# Patient Record
Sex: Female | Born: 2000 | Hispanic: Yes | Marital: Single | Smoking: Never smoker
Health system: Southern US, Community
[De-identification: ages and names within clinical notes are randomized; demographics above are authoritative.]

## PROBLEM LIST (undated history)

## (undated) DIAGNOSIS — F99 Mental disorder, not otherwise specified: Secondary | ICD-10-CM

## (undated) DIAGNOSIS — D649 Anemia, unspecified: Secondary | ICD-10-CM

## (undated) DIAGNOSIS — J45909 Unspecified asthma, uncomplicated: Secondary | ICD-10-CM

## (undated) HISTORY — PX: NO PAST SURGERIES: SHX2092

## (undated) HISTORY — DX: Anemia, unspecified: D64.9

## (undated) HISTORY — DX: Mental disorder, not otherwise specified: F99

---
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
## 2021-01-02 DIAGNOSIS — F919 Conduct disorder, unspecified: Secondary | ICD-10-CM | POA: Diagnosis not present

## 2021-01-09 DIAGNOSIS — F919 Conduct disorder, unspecified: Secondary | ICD-10-CM | POA: Diagnosis not present

## 2021-02-12 ENCOUNTER — Encounter: Payer: Self-pay | Admitting: Emergency Medicine

## 2021-02-12 ENCOUNTER — Emergency Department
Admission: EM | Admit: 2021-02-12 | Discharge: 2021-02-12 | Disposition: A | Discharge status: Home / Self Care | Payer: Medicaid Other | Attending: Emergency Medicine | Admitting: Emergency Medicine

## 2021-02-12 DIAGNOSIS — J45909 Unspecified asthma, uncomplicated: Secondary | ICD-10-CM | POA: Diagnosis not present

## 2021-02-12 DIAGNOSIS — H6093 Unspecified otitis externa, bilateral: Secondary | ICD-10-CM | POA: Diagnosis not present

## 2021-02-12 DIAGNOSIS — H9202 Otalgia, left ear: Secondary | ICD-10-CM | POA: Diagnosis present

## 2021-02-12 DIAGNOSIS — H6692 Otitis media, unspecified, left ear: Secondary | ICD-10-CM | POA: Diagnosis not present

## 2021-02-12 DIAGNOSIS — H60503 Unspecified acute noninfective otitis externa, bilateral: Secondary | ICD-10-CM

## 2021-02-12 DIAGNOSIS — H60502 Unspecified acute noninfective otitis externa, left ear: Secondary | ICD-10-CM | POA: Diagnosis not present

## 2021-02-12 MED ORDER — AMOXICILLIN-POT CLAVULANATE 875-125 MG PO TABS: 1.0000 | ORAL_TABLET | Freq: Two times a day (BID) | ORAL | 0 refills | Status: AC

## 2021-02-12 MED ORDER — CIPROFLOXACIN-DEXAMETHASONE 0.3-0.1 % OT SUSP: 4.0000 [drp] | Freq: Two times a day (BID) | OTIC | 0 refills | Status: AC

## 2021-02-12 MED ORDER — AMOXICILLIN-POT CLAVULANATE 875-125 MG PO TABS
1.0000 | ORAL_TABLET | Freq: Once | ORAL | Status: AC
  Administered 2021-02-12: 1 via ORAL
  Filled 2021-02-12: qty 1

## 2021-02-12 MED ORDER — CIPROFLOXACIN-DEXAMETHASONE 0.3-0.1 % OT SUSP
4.0000 [drp] | OTIC | Status: AC
  Administered 2021-02-12: 4 [drp] via OTIC
  Filled 2021-02-12: qty 7.5

## 2021-02-12 NOTE — Discharge Instructions (Addendum)
As we discussed, you appear to have an infection in both of your ears but worse on the left.  Please use the prescribed antibiotic pills as well as the drops.  Place 4 drops into both ears twice daily for 1 week.  Complete the full course of antibiotic pills as prescribed.  Call the office of Dr. Andee Poles to schedule a follow-up appointment with him or one of his colleagues in about a week.

## 2021-02-12 NOTE — ED Provider Notes (Signed)
[facility] Emergency Department Provider Note  ____________________________________________   Event Date/Time   First MD Initiated Contact with Patient 02/12/21 [phone]     (approximate)  I have reviewed the triage vital signs and the nursing notes.   HISTORY  Chief Complaint Ear Pain    HPI Betty Cabrera is a 20 y.o. female who presents for evaluation of pain and decreased hearing in her left ear.  It started about 3 days ago.  She has not had any drainage nor fever but now she is starting to have pain on the right side as well.  She thinks she went swimming once or twice but does not swim regularly.  Nothing in particular makes it better or worse.  No swelling [place] Plan which she is aware.  No difficulty swallowing.  However the pain does seem to radiate down the left ear into the side of the neck.     Past Medical History:  Diagnosis Date   Asthma     Patient Active Problem List   Diagnosis Date Noted   Vitamin D deficiency 11/13/2015   Allergic rhinitis due to pollen 11/13/2015   Elevated blood pressure 10/04/2015   Obesity, pediatric, BMI 95th to 98th percentile for age 73/02/2016   Mild intermittent asthma 10/04/2015    History reviewed. No pertinent surgical history.  Prior to Admission medications   Medication Sig Start Date End Date Taking? Authorizing Provider  amoxicillin-clavulanate (AUGMENTIN) 875-125 MG tablet Take 1 tablet by mouth every 12 (twelve) hours for 7 days. 02/12/21 02/19/21 Yes Loleta Rose, MD  ciprofloxacin-dexamethasone (CIPRODEX) OTIC suspension Place 4 drops into both ears 2 (two) times daily for 7 days. 02/12/21 02/19/21 Yes Loleta Rose, MD  albuterol (PROVENTIL HFA;VENTOLIN HFA) 108 (90 Base) MCG/ACT inhaler Inhale 2-4 puffs into the lungs every 4 (four) hours as needed for wheezing (or cough). 11/14/15   Verneda Skill, FNP  cetirizine (ZYRTEC) 1 MG/ML syrup Take 10 mLs (10 mg total) by mouth daily. As needed  for allergy symptoms Patient not taking: Reported on 12/06/2015 11/14/15   Alfonso Ramus T, FNP  permethrin (ELIMITE) 5 % cream Apply overnight. Shower the next day and wash the bed linens in hot water. Repeat entire process in 1 week. 12/06/15   Kalman Jewels, MD    Allergies Patient has no known allergies.  History reviewed. No pertinent family history.  Social History Social History   Tobacco Use   Smoking status: Never   Smokeless tobacco: Never  Vaping Use   Vaping Use: Never used  Substance Use Topics   Alcohol use: Never    Alcohol/week: 0.0 standard drinks   Drug use: Never    Review of Systems Constitutional: No fever/chills Eyes: No visual changes. ENT: Positive for left ear pain decreased hearing.  No sore throat. Respiratory: Denies shortness of breath. Gastrointestinal: No abdominal pain.  No nausea, no vomiting.  No diarrhea.  No constipation. Neurological: Negative for headaches, focal weakness or numbness.   ____________________________________________   PHYSICAL EXAM:  VITAL SIGNS: ED Triage Vitals  Enc Vitals Group     BP 02/12/21 0048 (!) 144/77     Pulse Rate 02/12/21 0048 (!) 102     Resp 02/12/21 0048 20     Temp 02/12/21 0048 98.7 F (37.1 C)     Temp Source 02/12/21 0048 Oral     SpO2 02/12/21 0048 100 %     Weight 02/12/21 0042 113.4 kg (250 lb)  Height 02/12/21 0042 1.676 m (5\' 6" )     Head Circumference --      Peak Flow --      Pain Score --      Pain Loc --      Pain Edu? --      Excl. in GC? --     Constitutional: Alert and oriented.  Eyes: Conjunctivae are normal.  Head: Atraumatic.  No tenderness to palpation of the mastoid process. Ears: Right ear canal has some minimal debris and erythema but a healthy appearing tympanic membrane.  The left ear canal has a small amount of swelling but is very erythematous with extensive debris consistent with otitis media.  Her tympanic membrane also appears erythematous with an  effusion behind it. Nose: No congestion/rhinnorhea. Mouth/Throat: Patient is wearing a mask. Neck: No stridor.  No meningeal signs.  No tenderness to palpation and no lymphadenopathy. Cardiovascular: Normal rate, regular rhythm. Good peripheral circulation. Respiratory: Normal respiratory effort.  No retractions. Neurologic:  Normal speech and language. No gross focal neurologic deficits are appreciated.  Skin:  Skin is warm, dry and intact. Psychiatric: Mood and affect are normal. Speech and behavior are normal.  ____________________________________________    INITIAL IMPRESSION / MDM / ASSESSMENT AND PLAN / ED COURSE  As part of my medical decision making, I reviewed the following data within the electronic MEDICAL RECORD NUMBER Nursing notes reviewed and incorporated and Notes from prior ED visits   Signs and symptoms are consistent with otitis media bilaterally although the right side is mild but the left side is significant although not consistent with malignant externa.  Given the involvement of the tympanic membrane and decreased hearing from the left side, I will treat with Ciprodex and Augmentin.  I gave the patient my usual treatment recommendations and follow-up recommendations from ENT as well as return precautions.  She understands any reasons with plan.  ____________________________________________  FINAL CLINICAL IMPRESSION(S) / ED DIAGNOSES  Final diagnoses:  Acute otitis externa of both ears, unspecified type  Left otitis media, unspecified otitis media type     MEDICATIONS GIVEN DURING THIS VISIT:  Medications  ciprofloxacin-dexamethasone (CIPRODEX) 0.3-0.1 % OTIC (EAR) suspension 4 drop (4 drops Both EARS Given 02/12/21 0441)  amoxicillin-clavulanate (AUGMENTIN) 875-125 MG per tablet 1 tablet (1 tablet Oral Given 02/12/21 0456)     ED Discharge Orders          Ordered    amoxicillin-clavulanate (AUGMENTIN) 875-125 MG tablet  Every 12 hours        02/12/21 0450     ciprofloxacin-dexamethasone (CIPRODEX) OTIC suspension  2 times daily        02/12/21 0450             Note:  This document was prepared using Dragon voice recognition software and may include unintentional dictation errors.   02/14/21, MD 02/12/21 [phone]

## 2021-02-12 NOTE — ED Triage Notes (Signed)
Pt c/o left ear pain with decreased ability to hear x3 days. Pt denies drainage as well as fever. Pt sts pain radiates down left side of neck.

## 2021-03-29 ENCOUNTER — Other Ambulatory Visit: Payer: Self-pay

## 2021-03-29 DIAGNOSIS — R21 Rash and other nonspecific skin eruption: Secondary | ICD-10-CM | POA: Diagnosis present

## 2021-03-29 DIAGNOSIS — J45909 Unspecified asthma, uncomplicated: Secondary | ICD-10-CM | POA: Diagnosis not present

## 2021-03-29 DIAGNOSIS — T7840XA Allergy, unspecified, initial encounter: Secondary | ICD-10-CM | POA: Diagnosis not present

## 2021-03-29 DIAGNOSIS — L509 Urticaria, unspecified: Secondary | ICD-10-CM | POA: Diagnosis not present

## 2021-03-29 MED ORDER — DIPHENHYDRAMINE HCL 25 MG PO CAPS: 50.0000 mg | ORAL_CAPSULE | Freq: Once | ORAL | Status: AC

## 2021-03-29 MED ORDER — PREDNISONE 20 MG PO TABS
40.0000 mg | ORAL_TABLET | Freq: Once | ORAL | Status: AC
  Administered 2021-03-29: 40 mg via ORAL

## 2021-03-29 MED ORDER — DIPHENHYDRAMINE HCL 25 MG PO CAPS
ORAL_CAPSULE | ORAL | Status: AC
  Administered 2021-03-29: 50 mg via ORAL
  Filled 2021-03-29: qty 2

## 2021-03-29 NOTE — ED Triage Notes (Addendum)
Pt states around 20 min ago started having burning sensation to neck, head and shoulder. Pt also feels like she has a rash to neck and forehead. Pt has not had any new exposures or cause. Pt with hives noted to face and neck.

## 2021-03-30 ENCOUNTER — Emergency Department
Admission: EM | Admit: 2021-03-30 | Discharge: 2021-03-30 | Disposition: A | Discharge status: Home / Self Care | Payer: Medicaid Other | Attending: Emergency Medicine | Admitting: Emergency Medicine

## 2021-03-30 DIAGNOSIS — L509 Urticaria, unspecified: Secondary | ICD-10-CM

## 2021-03-30 DIAGNOSIS — T7840XA Allergy, unspecified, initial encounter: Secondary | ICD-10-CM | POA: Diagnosis not present

## 2021-03-30 MED ORDER — PREDNISONE 20 MG PO TABS: 40.0000 mg | ORAL_TABLET | Freq: Every day | ORAL | 0 refills | Status: AC

## 2021-03-30 NOTE — ED Notes (Signed)
RN in to check on pt at this time. Pt states symptoms have resolved, rash no longer present.

## 2021-03-30 NOTE — Discharge Instructions (Addendum)
You had an allergic reaction to something, possibly the detergent you recently used.  Please try to avoid coming to contact with anything else that might cause recurrence of your symptoms.  You can use over-the-counter Benadryl or Zyrtec (according to label instructions).  Use the prescribed prednisone to try and avoid additional episodes.  Return to the emergency department if you develop new or worsening symptoms that concern you.

## 2021-03-30 NOTE — ED Provider Notes (Signed)
[facility] Emergency Department Provider Note  ____________________________________________   Event Date/Time   First MD Initiated Contact with Patient 03/30/21 [phone]     (approximate)  I have reviewed the triage vital signs and the nursing notes.   HISTORY  Chief Complaint Rash    HPI Betty Cabrera is a 20 y.o. female with no chronic medical issues who presents for evaluation of cute onset itching and burning along her face and neck with a raised red rash.  She recently changed laundry detergents and thinks it might of been because of that.  It was acute in onset at about 9 PM and nothing in particular made it better or worse.  She describes it as severe.  It completely went away after being treated in triage with Benadryl and prednisone.  Patient denies shortness of breath, difficulty breathing, difficulty speaking, difficulty swallowing, nausea, vomiting, abdominal pain, and diarrhea.  She did not feel lightheaded or dizzy or like she was going to pass out.  She has never had symptoms like this before.  No other recent allergen exposures she can think of.  No drug use.     Past Medical History:  Diagnosis Date   Asthma     Patient Active Problem List   Diagnosis Date Noted   Vitamin D deficiency 11/13/2015   Allergic rhinitis due to pollen 11/13/2015   Elevated blood pressure 10/04/2015   Obesity, pediatric, BMI 95th to 98th percentile for age 20/02/2016   Mild intermittent asthma 10/04/2015    No past surgical history on file.  Prior to Admission medications   Medication Sig Start Date End Date Taking? Authorizing Provider  predniSONE (DELTASONE) 20 MG tablet Take 2 tablets (40 mg total) by mouth daily with breakfast for 4 days. 03/30/21 04/03/21 Yes Loleta Rose, MD  albuterol (PROVENTIL HFA;VENTOLIN HFA) 108 (90 Base) MCG/ACT inhaler Inhale 2-4 puffs into the lungs every 4 (four) hours as needed for wheezing (or cough). 11/14/15    Verneda Skill, FNP  cetirizine (ZYRTEC) 1 MG/ML syrup Take 10 mLs (10 mg total) by mouth daily. As needed for allergy symptoms Patient not taking: Reported on 12/06/2015 11/14/15   Alfonso Ramus T, FNP  permethrin (ELIMITE) 5 % cream Apply overnight. Shower the next day and wash the bed linens in hot water. Repeat entire process in 1 week. 12/06/15   Kalman Jewels, MD    Allergies Patient has no known allergies.  No family history on file.  Social History Social History   Tobacco Use   Smoking status: Never   Smokeless tobacco: Never  Vaping Use   Vaping Use: Never used  Substance Use Topics   Alcohol use: Never    Alcohol/week: 0.0 standard drinks   Drug use: Never    Review of Systems Constitutional: No fever/chills Eyes: No visual changes. ENT: No sore throat. Cardiovascular: Denies chest pain. Respiratory: Denies shortness of breath. Gastrointestinal: No abdominal pain.  No nausea, no vomiting.  No diarrhea.  No constipation. Genitourinary: Negative for dysuria. Musculoskeletal: Negative for neck pain.  Negative for back pain. Integumentary: Positive for itching red rash on face and neck. Neurological: Negative for headaches, focal weakness or numbness.   ____________________________________________   PHYSICAL EXAM:  VITAL SIGNS: ED Triage Vitals [03/29/21 2049]  Enc Vitals Group     BP (!) 156/78     Pulse Rate 93     Resp 20     Temp 98.5 F (36.9 C)  Temp Source Oral     SpO2 98 %     Weight 116.1 kg (256 lb)     Height 1.702 m (5\' 7" )     Head Circumference      Peak Flow      Pain Score 0     Pain Loc      Pain Edu?      Excl. in GC?     Constitutional: Alert and oriented.  Eyes: Conjunctivae are normal.  Head: Atraumatic. Nose: No congestion/rhinnorhea. Mouth/Throat: Patient is wearing a mask. Neck: No stridor.  No meningeal signs.   Cardiovascular: Normal rate, regular rhythm. Good peripheral circulation. Respiratory: Normal  respiratory effort.  No retractions. Gastrointestinal: Soft and nontender. No distention.  Musculoskeletal: No lower extremity tenderness nor edema. No gross deformities of extremities. Neurologic:  Normal speech and language. No gross focal neurologic deficits are appreciated.  Skin:  Skin is warm, dry and intact. Psychiatric: Mood and affect are normal. Speech and behavior are normal.  ____________________________________________    INITIAL IMPRESSION / MDM / ASSESSMENT AND PLAN / ED COURSE  As part of my medical decision making, I reviewed the following data within the electronic MEDICAL RECORD NUMBER Nursing notes reviewed and incorporated, Old chart reviewed, and Notes from prior ED visits   Differential diagnosis includes, but is not limited to, allergic reaction, anaphylaxis, infection.  Vital signs stable.  Patient has been waiting in the emergency department for over 8 hours and the symptoms have completely resolved.  No rebound.  Treated with Benadryl and prednisone in the lobby.  Symptoms consistent with relatively mild allergic reaction.  No evidence of persistent urticaria at this time although the nurse that sought initially told me that it looks urticarial.  I had my usual and customary allergic reaction discussion with the patient and provided recommendations as well as a as a prescription as listed below.  She understands and agrees with the plan.           ____________________________________________  FINAL CLINICAL IMPRESSION(S) / ED DIAGNOSES  Final diagnoses:  Urticaria  Allergic reaction, initial encounter     MEDICATIONS GIVEN DURING THIS VISIT:  Medications  diphenhydrAMINE (BENADRYL) capsule 50 mg (50 mg Oral Given 03/29/21 2058)  predniSONE (DELTASONE) tablet 40 mg (40 mg Oral Given 03/29/21 2059)     ED Discharge Orders          Ordered    predniSONE (DELTASONE) 20 MG tablet  Daily with breakfast        03/30/21 0534             Note:  This  document was prepared using Dragon voice recognition software and may include unintentional dictation errors.   05/30/21, MD 03/30/21 [phone]

## 2021-04-28 DIAGNOSIS — F919 Conduct disorder, unspecified: Secondary | ICD-10-CM | POA: Diagnosis not present

## 2021-05-04 DIAGNOSIS — F919 Conduct disorder, unspecified: Secondary | ICD-10-CM | POA: Diagnosis not present

## 2021-05-13 DIAGNOSIS — F919 Conduct disorder, unspecified: Secondary | ICD-10-CM | POA: Diagnosis not present

## 2021-05-19 DIAGNOSIS — F919 Conduct disorder, unspecified: Secondary | ICD-10-CM | POA: Diagnosis not present

## 2021-05-26 DIAGNOSIS — F919 Conduct disorder, unspecified: Secondary | ICD-10-CM | POA: Diagnosis not present

## 2021-06-02 DIAGNOSIS — F919 Conduct disorder, unspecified: Secondary | ICD-10-CM | POA: Diagnosis not present

## 2021-06-29 DIAGNOSIS — F919 Conduct disorder, unspecified: Secondary | ICD-10-CM | POA: Diagnosis not present

## 2021-07-06 DIAGNOSIS — F919 Conduct disorder, unspecified: Secondary | ICD-10-CM | POA: Diagnosis not present

## 2021-07-14 DIAGNOSIS — F919 Conduct disorder, unspecified: Secondary | ICD-10-CM | POA: Diagnosis not present

## 2021-07-20 DIAGNOSIS — F919 Conduct disorder, unspecified: Secondary | ICD-10-CM | POA: Diagnosis not present

## 2021-07-28 DIAGNOSIS — F919 Conduct disorder, unspecified: Secondary | ICD-10-CM | POA: Diagnosis not present

## 2021-08-03 DIAGNOSIS — F919 Conduct disorder, unspecified: Secondary | ICD-10-CM | POA: Diagnosis not present

## 2021-08-07 ENCOUNTER — Ambulatory Visit (LOCAL_COMMUNITY_HEALTH_CENTER): Payer: Medicaid Other | Admitting: Nurse Practitioner

## 2021-08-07 ENCOUNTER — Other Ambulatory Visit: Payer: Self-pay

## 2021-08-07 ENCOUNTER — Encounter: Payer: Self-pay | Admitting: Family Medicine

## 2021-08-07 VITALS — BP 152/88 | Ht 67.0 in | Wt 252.6 lb

## 2021-08-07 DIAGNOSIS — Z0000 Encounter for general adult medical examination without abnormal findings: Secondary | ICD-10-CM | POA: Diagnosis not present

## 2021-08-07 DIAGNOSIS — Z3009 Encounter for other general counseling and advice on contraception: Secondary | ICD-10-CM

## 2021-08-07 DIAGNOSIS — R030 Elevated blood-pressure reading, without diagnosis of hypertension: Secondary | ICD-10-CM

## 2021-08-07 NOTE — Progress Notes (Signed)
[facility] DEPARTMENT [facility] [address] Main Number: [phone]    Family Planning Visit- Initial Visit  Subjective:  Betty Cabrera is a 21 y.o.  [id number]   being seen today for an initial annual visit and to discuss reproductive life planning.  The patient is currently using No Method - No Contraceptive Precautions for pregnancy prevention. Patient reports   does want a pregnancy in the next year.  Patient has the following medical conditions has Elevated blood pressure; Obesity, pediatric, BMI 95th to 98th percentile for age; Mild intermittent asthma; Vitamin D deficiency; and Allergic rhinitis due to pollen on their problem list.  Chief Complaint  Patient presents with   Gynecologic Exam    Patient reports to the clinic today for a physical.    Patient denies any signs and symptoms    Body mass index is 39.56 kg/m. - Patient is eligible for diabetes screening based on BMI and age >69?  not applicable HA1C ordered? not applicable  Patient reports 1  partner/s in last year. Desires STI screening?  No - patient declines  Has patient been screened once for HCV in the past?  No  No results found for: HCVAB  Does the patient have current drug use (including MJ), have a partner with drug use, and/or has been incarcerated since last result? No  If yes-- Screen for HCV through [facility]   Does the patient meet criteria for HBV testing? No  Criteria:  -Household, sexual or needle sharing contact with HBV -History of drug use -HIV positive -Those with known Hep C   Health Maintenance Due  Topic Date Due   COVID-19 Vaccine (1) Never done   Pneumococcal Vaccine 41-50 Years old (1 - PPSV23 if available, else PCV20) 07/10/2004   CHLAMYDIA SCREENING  04/08/2017   HIV Screening  Never done   Hepatitis C Screening  Never done   INFLUENZA VACCINE  02/26/2021    Review of Systems  Constitutional:  Negative for chills, fever,  malaise/fatigue and weight loss.  HENT:  Negative for congestion, hearing loss and sore throat.   Eyes:  Positive for blurred vision. Negative for double vision and photophobia.  Respiratory:  Positive for wheezing. Negative for shortness of breath.   Cardiovascular:  Negative for chest pain.  Gastrointestinal:  Positive for nausea. Negative for abdominal pain, blood in stool, constipation, diarrhea, heartburn and vomiting.  Genitourinary:  Positive for frequency. Negative for dysuria.  Musculoskeletal:  Negative for back pain, joint pain and neck pain.  Skin:  Negative for itching and rash.  Neurological:  Positive for headaches. Negative for dizziness and weakness.  Endo/Heme/Allergies:  Does not bruise/bleed easily.  Psychiatric/Behavioral:  Negative for depression, substance abuse and suicidal ideas.    The following portions of the patient's history were reviewed and updated as appropriate: allergies, current medications, past family history, past medical history, past social history, past surgical history and problem list. Problem list updated.   See flowsheet for other program required questions.  Objective:   Vitals:   08/07/21 1437  BP: (!) 152/88  Weight: 252 lb 9.6 oz (114.6 kg)  Height: 5\' 7"  (1.702 m)    Physical Exam Constitutional:      Appearance: She is obese.  HENT:     Head: Normocephalic.     Right Ear: Tympanic membrane normal.     Left Ear: Tympanic membrane normal.     Mouth/Throat:     Mouth: Mucous membranes  are moist.     Comments: No visible signs of dental caries. Dr. Metta Clines,  last visit within a year.  Eyes:     Pupils: Pupils are equal, round, and reactive to light.  Cardiovascular:     Rate and Rhythm: Normal rate.  Pulmonary:     Effort: Pulmonary effort is normal.     Breath sounds: Examination of the right-upper field reveals wheezing. Examination of the left-upper field reveals wheezing. Examination of the right-middle field reveals  wheezing. Examination of the left-middle field reveals wheezing. Examination of the right-lower field reveals wheezing. Examination of the left-lower field reveals wheezing. Wheezing present.  Abdominal:     General: Abdomen is flat.     Palpations: Abdomen is soft.  Genitourinary:    Comments: No vaginal exam performed  Musculoskeletal:     Cervical back: Full passive range of motion without pain, normal range of motion and neck supple.  Skin:    General: Skin is warm and dry.     Comments: Dark discoloration to neck.   Neurological:     Mental Status: She is alert and oriented to person, place, and time.  Psychiatric:        Attention and Perception: Attention normal.        Behavior: Behavior is cooperative.      Assessment and Plan:  [name] is a 21 y.o. female presenting to the [facility] Department for an initial annual wellness/contraceptive visit  Contraception counseling: Reviewed all forms of birth control options in the tiered based approach. available including abstinence; over the counter/barrier methods; hormonal contraceptive medication including pill, patch, ring, injection,contraceptive implant, ECP; hormonal and nonhormonal IUDs; permanent sterilization options including vasectomy and the various tubal sterilization modalities. Risks, benefits, and typical effectiveness rates were reviewed.  Questions were answered.  Written information was also given to the patient to review.  Patient desires Pregnant/Seeking Pregnancy, this was prescribed for patient.    The patient will follow up in  1 year for surveillance.  The patient was told to call with any further questions, or with any concerns about this method of contraception.  Emphasized use of condoms 100% of the time for STI prevention.  Patient was not offered ECP based on patient's desire to be pregnant.    1. Family planning - 21 year old female in clinic today for physical.  Patient  desires to be pregnant. Provided preconception counseling as well as resources.  Patient also advised to take a prenatal vitamins daily.   2. Well woman exam (no gynecological exam) -Reviewed the patient's review of systems.  Patient reports blurry vision while reading or looking at her phone, frequent urination, headaches daily, nausea and vomiting twice a day 3-4 times a week, and wheezing.  Patient encouraged to obtain a PCP to address general questions or concerns and to assess frequent urination.  Patient denies burning and odor while urinating.  Encouraged patient to see an ophthalmologist to address blurry vision.  Advised patient that nausea and vomiting could be attributed to certain foods patient is eating, also encouraged to eat small frequent meals and increase protein.  Patient states that she was getting over a cold and has been having some wheezing.  Denies difficulty breathing, no signs of distress.  Wheezing noted on physical exam.  Advised to seek additional follow up if wheezing continues or no improvement.  Patient encouraged to drink 6-8 glasses of 8 oz water daily and eat small frequent meals to assist  with headaches.  Patient reports headaches are relieved by Tylenol.   -PAP not due until age 21, CBE at age 21.     3. Elevated BP without diagnosis of hypertension -Elevated BP during exam.  Patient states she gets nervous when she goes to the doctor.  Advised to follow up with a PCP to assess BP readings.       Return in about 1 year (around 08/07/2022) for Annual well-woman exam.  No future appointments.  Glenna FellowsAyo Waymon Laser, FNP

## 2021-08-07 NOTE — Progress Notes (Signed)
Here today for a PE. No record of PE's here. Unsure of most recent PE. Declines birth control. Desires pregnancy within the next year. HIV testing accepted. Tawny Hopping, RN

## 2021-08-11 DIAGNOSIS — F919 Conduct disorder, unspecified: Secondary | ICD-10-CM | POA: Diagnosis not present

## 2021-08-18 DIAGNOSIS — F919 Conduct disorder, unspecified: Secondary | ICD-10-CM | POA: Diagnosis not present

## 2021-10-08 ENCOUNTER — Encounter: Payer: Self-pay | Admitting: Emergency Medicine

## 2021-10-08 ENCOUNTER — Other Ambulatory Visit: Payer: Self-pay

## 2021-10-08 ENCOUNTER — Emergency Department: Payer: Medicaid Other

## 2021-10-08 DIAGNOSIS — R079 Chest pain, unspecified: Secondary | ICD-10-CM | POA: Diagnosis not present

## 2021-10-08 DIAGNOSIS — R030 Elevated blood-pressure reading, without diagnosis of hypertension: Secondary | ICD-10-CM | POA: Diagnosis not present

## 2021-10-08 DIAGNOSIS — J45909 Unspecified asthma, uncomplicated: Secondary | ICD-10-CM | POA: Insufficient documentation

## 2021-10-08 DIAGNOSIS — R0602 Shortness of breath: Secondary | ICD-10-CM | POA: Diagnosis not present

## 2021-10-08 DIAGNOSIS — Z20822 Contact with and (suspected) exposure to covid-19: Secondary | ICD-10-CM | POA: Diagnosis not present

## 2021-10-08 DIAGNOSIS — R0789 Other chest pain: Secondary | ICD-10-CM | POA: Insufficient documentation

## 2021-10-08 DIAGNOSIS — D72829 Elevated white blood cell count, unspecified: Secondary | ICD-10-CM | POA: Insufficient documentation

## 2021-10-08 DIAGNOSIS — I10 Essential (primary) hypertension: Secondary | ICD-10-CM | POA: Diagnosis not present

## 2021-10-08 DIAGNOSIS — R208 Other disturbances of skin sensation: Secondary | ICD-10-CM | POA: Diagnosis not present

## 2021-10-08 DIAGNOSIS — D75839 Thrombocytosis, unspecified: Secondary | ICD-10-CM | POA: Diagnosis not present

## 2021-10-08 DIAGNOSIS — R519 Headache, unspecified: Secondary | ICD-10-CM | POA: Diagnosis not present

## 2021-10-08 LAB — CBC
HCT: 37.2 % (ref 36.0–46.0)
Hemoglobin: 11.2 g/dL — ABNORMAL LOW (ref 12.0–15.0)
MCH: 22.6 pg — ABNORMAL LOW (ref 26.0–34.0)
MCHC: 30.1 g/dL (ref 30.0–36.0)
MCV: 75 fL — ABNORMAL LOW (ref 80.0–100.0)
Platelets: 467 10*3/uL — ABNORMAL HIGH (ref 150–400)
RBC: 4.96 MIL/uL (ref 3.87–5.11)
RDW: 14.8 % (ref 11.5–15.5)
WBC: 11.4 10*3/uL — ABNORMAL HIGH (ref 4.0–10.5)
nRBC: 0 % (ref 0.0–0.2)

## 2021-10-08 NOTE — ED Notes (Signed)
Pt provided urine cup, used bathroom just prior to triage, asked for UA ASAP. Pt states understanding.   ?

## 2021-10-08 NOTE — ED Triage Notes (Signed)
Pt to ED via POV with c/o feeling HA and feeling like her "head is twitching". Pt c/o feeling like her "heart is hurting", reports pain radiates down her L arm, also reports that her saliva was coming out "foamy white". Pt A&O x4, NAD noted at this time, able to speak in full and complete sentences at this time.  ?

## 2021-10-09 ENCOUNTER — Emergency Department
Admission: EM | Admit: 2021-10-09 | Discharge: 2021-10-09 | Disposition: A | Discharge status: Home / Self Care | Payer: Medicaid Other | Attending: Emergency Medicine | Admitting: Emergency Medicine

## 2021-10-09 DIAGNOSIS — R0789 Other chest pain: Secondary | ICD-10-CM | POA: Diagnosis not present

## 2021-10-09 DIAGNOSIS — I10 Essential (primary) hypertension: Secondary | ICD-10-CM | POA: Diagnosis not present

## 2021-10-09 DIAGNOSIS — R030 Elevated blood-pressure reading, without diagnosis of hypertension: Secondary | ICD-10-CM

## 2021-10-09 DIAGNOSIS — R519 Headache, unspecified: Secondary | ICD-10-CM

## 2021-10-09 LAB — BASIC METABOLIC PANEL
Anion gap: 8 (ref 5–15)
BUN: 9 mg/dL (ref 6–20)
CO2: 29 mmol/L (ref 22–32)
Calcium: 9.4 mg/dL (ref 8.9–10.3)
Chloride: 101 mmol/L (ref 98–111)
Creatinine, Ser: 0.53 mg/dL (ref 0.44–1.00)
GFR, Estimated: >60 mL/min (ref >60)
Glucose, Bld: 96 mg/dL (ref 70–99)
Potassium: 3.7 mmol/L (ref 3.5–5.1)
Sodium: 138 mmol/L (ref 135–145)

## 2021-10-09 LAB — TROPONIN I (HIGH SENSITIVITY)
Troponin I (High Sensitivity): 4 ng/L (ref <18)
Troponin I (High Sensitivity): 3 ng/L (ref <18)

## 2021-10-09 LAB — POC URINE PREG, ED: Preg Test, Ur: NEGATIVE

## 2021-10-09 LAB — RESP PANEL BY RT-PCR (FLU A&B, COVID) ARPGX2
Influenza A by PCR: NEGATIVE
Influenza B by PCR: NEGATIVE
SARS Coronavirus 2 by RT PCR: NEGATIVE

## 2021-10-09 MED ORDER — IBUPROFEN 600 MG PO TABS
600.0000 mg | ORAL_TABLET | Freq: Once | ORAL | Status: AC
  Administered 2021-10-09: 600 mg via ORAL
  Filled 2021-10-09: qty 1

## 2021-10-09 NOTE — Discharge Instructions (Signed)

## 2021-10-09 NOTE — ED Provider Notes (Signed)
? ?[facility] ?Provider Note ? ? ? Event Date/Time  ? First MD Initiated Contact with Patient 10/09/21 0057   ?  (approximate) ? ? ?History  ? ?Headache and Chest Pain ? ? ?HPI ? ?Betty Cabrera is a 21 y.o. female with a history of asthma, anemia, obesity who presents for evaluation of chest pain and headache.  Patient reports being in her usual state of health when she was laying in bed this evening.  She started feeling pins-and-needles in the left side of her chest.  She then started feeling pins-and-needles in the left upper arm.  She then felt like the muscles of the left side of her head were twitching.  She then felt the need to spit up and when she did she reports that her saliva was foamy white.  She was concerned about all of these things which prompted her visit to the ER.  No personal or family history.  DVT, no recent travel immobilization, no leg pain or swelling, no hemoptysis or exogenous hormones.  No vomiting or diarrhea.  No cough, congestion, fever or chills.  No abdominal pain. ?  ? ? ?Past Medical History:  ?Diagnosis Date  ? Anemia   ? Asthma   ? Mental disorder   ? ? ?Past Surgical History:  ?Procedure Laterality Date  ? NO PAST SURGERIES    ? ? ? ?Physical Exam  ? ?Triage Vital Signs: ?ED Triage Vitals [10/08/21 2315]  ?Enc Vitals Group  ?   BP (!) 153/95  ?   Pulse Rate 86  ?   Resp 20  ?   Temp 98.4 ?F (36.9 ?C)  ?   Temp Source Oral  ?   SpO2 94 %  ?   Weight 250 lb (113.4 kg)  ?   Height 5\' 5"  (1.651 m)  ?   Head Circumference   ?   Peak Flow   ?   Pain Score 8  ?   Pain Loc   ?   Pain Edu?   ?   Excl. in GC?   ? ? ?Most recent vital signs: ?Vitals:  ? 10/08/21 2315  ?BP: (!) 153/95  ?Pulse: 86  ?Resp: 20  ?Temp: 98.4 ?F (36.9 ?C)  ?SpO2: 94%  ? ? ? ?Constitutional: Alert and oriented. Well appearing and in no apparent distress. ?HEENT: ?     Head: Normocephalic and atraumatic.    ?     Eyes: Conjunctivae are normal. Sclera is non-icteric.  ?      Mouth/Throat: Mucous membranes are moist.  ?     Neck: Supple with no signs of meningismus. ?Cardiovascular: Regular rate and rhythm. No murmurs, gallops, or rubs. 2+ symmetrical distal pulses are present in all extremities.  ?Respiratory: Normal respiratory effort. Lungs are clear to auscultation bilaterally.  ?Gastrointestinal: Soft, non tender, and non distended with positive bowel sounds. No rebound or guarding. ?Genitourinary: No CVA tenderness. ?Musculoskeletal:  No edema, cyanosis, or erythema of extremities. ?Neurologic: Normal speech and language. Face is symmetric. Moving all extremities. No gross focal neurologic deficits are appreciated. ?Skin: Skin is warm, dry and intact. No rash noted. ?Psychiatric: Mood and affect are normal. Speech and behavior are normal. ? ?ED Results / Procedures / Treatments  ? ?Labs ?(all labs ordered are listed, but only abnormal results are displayed) ?Labs Reviewed  ?CBC - Abnormal; Notable for the following components:  ?    Result Value  ? WBC 11.4 (*)   ?  Hemoglobin 11.2 (*)   ? MCV 75.0 (*)   ? MCH 22.6 (*)   ? Platelets 467 (*)   ? All other components within normal limits  ?RESP PANEL BY RT-PCR (FLU A&B, COVID) ARPGX2  ?BASIC METABOLIC PANEL  ?POC URINE PREG, ED  ?TROPONIN I (HIGH SENSITIVITY)  ?TROPONIN I (HIGH SENSITIVITY)  ? ? ? ?EKG ? ?ED ECG REPORT ?I, Nita Sicklearolina Kwaku Mostafa, the attending physician, personally viewed and interpreted this ECG. ? ?Normal sinus rhythm with a rate of 91, normal intervals, normal axis, no ST elevations or depressions. ? ?RADIOLOGY ?I, Nita Sicklearolina Fredna Stricker, attending MD, have personally viewed and interpreted the images obtained during this visit as below: ? ?Chest x-ray with no acute findings ? ? ?___________________________________________________ ?Interpretation by Radiologist:  ?DG Chest 2 View ? ?Result Date: 10/09/2021 ?CLINICAL DATA:  Mid chest pain, shortness of breath starting a few hours ago. EXAM: CHEST - 2 VIEW COMPARISON:   07/29/2018 FINDINGS: The heart size and mediastinal contours are within normal limits. Both lungs are clear. The visualized skeletal structures are unremarkable. IMPRESSION: No active cardiopulmonary disease. Electronically Signed   By: Burman NievesWilliam  Stevens M.D.   On: 10/09/2021 00:06   ? ? ? ?PROCEDURES: ? ?Critical Care performed: No ? ?Procedures ? ? ? ?IMPRESSION / MDM / ASSESSMENT AND PLAN / ED COURSE  ?I reviewed the triage vital signs and the nursing notes. ? ?21 y.o. female with a history of asthma, anemia, obesity who presents for evaluation of chest pain and headache.  Patient describes sensation of pins-and-needles to the left side of her chest and a twitching of the left temple area.  She is extremely well-appearing in no distress, slightly hypertensive but otherwise neurologically intact.  Exam is nonfocal.  Pain is atypical for ACS.  Possibly GERD or anxiety.  EKG and 2 troponins were done which were unremarkable.  Chest x-ray with no signs of pneumonia.  Doubt PE with no tachypnea, tachycardia, hypoxia and intermittent symptoms.  Pregnancy test is negative.  Metabolic panel with no acute findings, no electrolyte derangements or AKI.  Mildly elevated white count of 11.4 with elevated platelets of 467.  This was present on blood work from 2020.  Nonspecific.  No signs of infectious etiology at this time.  COVID and flu negative.  Patient given ibuprofen for this mild twitching headache.  In relation to her elevated blood pressure recommended follow-up with PCP for reevaluation.  Patient was seen in 2017 by her primary care doctor and at that point was found to have elevated BP.  She has not been seen since then.  Commended close follow-up with primary care doctor for further evaluation recommended supportive care and close follow-up with PCP.  Recommended return to the hospital for new or worsening chest pain, shortness of breath, severe thunderclap headache.  Admission was considered but felt unnecessary  with a negative work-up. ? ?MEDICATIONS GIVEN IN ED: ?Medications  ?ibuprofen (ADVIL) tablet 600 mg (600 mg Oral Given 10/09/21 0115)  ? ? ?EMR reviewed including last visit with her primary care doctor from 2020 for acute upper respiratory infection ? ? ? ?FINAL CLINICAL IMPRESSION(S) / ED DIAGNOSES  ? ?Final diagnoses:  ?Atypical chest pain  ?Acute nonintractable headache, unspecified headache type  ?Elevated blood pressure reading  ? ? ? ?Rx / DC Orders  ? ?ED Discharge Orders   ? ? None  ? ?  ? ? ? ?Note:  This document was prepared using Dragon voice recognition software and may include unintentional  dictation errors. ? ? ?Please note:  Patient was evaluated in Emergency Department today for the symptoms described in the history of present illness. Patient was evaluated in the context of the global COVID-19 pandemic, which necessitated consideration that the patient might be at risk for infection with the SARS-CoV-2 virus that causes COVID-19. Institutional protocols and algorithms that pertain to the evaluation of patients at risk for COVID-19 are in a state of rapid change based on information released by regulatory bodies including the CDC and federal and state organizations. These policies and algorithms were followed during the patient's care in the ED.  Some ED evaluations and interventions may be delayed as a result of limited staffing during the pandemic. ? ? ? ? ?  ?Nita Sickle, MD ?10/09/21 0142 ? ?

## 2021-10-10 ENCOUNTER — Telehealth: Payer: Self-pay

## 2021-10-10 NOTE — Telephone Encounter (Signed)
Transition Care Management Follow-up Telephone Call ?Date of discharge and from where: 10/09/2021 from [facility] ?How have you been since you were released from the hospital? Patient stated that she is feeling better. Patient did not have any any questions or concerns at this time.  ?Any questions or concerns? No ? ?Items Reviewed: ?Did the pt receive and understand the discharge instructions provided? Yes  ?Medications obtained and verified? Yes  ?Other? No  ?Any new allergies since your discharge? No  ?Dietary orders reviewed? No ?Do you have support at home? Yes  ? ?Functional Questionnaire: (I = Independent and D = Dependent) ?ADLs: I ? ?Bathing/Dressing- I ? ?Meal Prep- I ? ?Eating- I ? ?Maintaining continence- I ? ?Transferring/Ambulation- I ? ?Managing Meds- I ? ?Follow up appointments reviewed: ? ?PCP Hospital f/u appt confirmed? No  ?Specialist Hospital f/u appt confirmed? No   ?Are transportation arrangements needed? No  ?If their condition worsens, is the pt aware to call PCP or go to the Emergency Dept.? Yes ?Was the patient provided with contact information for the PCP's office or ED? Yes ?Was to pt encouraged to call back with questions or concerns? Yes ? ?

## 2021-11-15 DIAGNOSIS — R5382 Chronic fatigue, unspecified: Secondary | ICD-10-CM | POA: Diagnosis not present

## 2021-11-15 DIAGNOSIS — Z0000 Encounter for general adult medical examination without abnormal findings: Secondary | ICD-10-CM | POA: Diagnosis not present

## 2021-11-15 DIAGNOSIS — R011 Cardiac murmur, unspecified: Secondary | ICD-10-CM | POA: Diagnosis not present

## 2021-11-15 DIAGNOSIS — Z13220 Encounter for screening for lipoid disorders: Secondary | ICD-10-CM | POA: Diagnosis not present

## 2021-11-15 DIAGNOSIS — F410 Panic disorder [episodic paroxysmal anxiety] without agoraphobia: Secondary | ICD-10-CM | POA: Diagnosis not present

## 2021-11-15 DIAGNOSIS — Z131 Encounter for screening for diabetes mellitus: Secondary | ICD-10-CM | POA: Diagnosis not present

## 2021-11-15 DIAGNOSIS — H543 Unqualified visual loss, both eyes: Secondary | ICD-10-CM | POA: Diagnosis not present

## 2021-11-15 DIAGNOSIS — D509 Iron deficiency anemia, unspecified: Secondary | ICD-10-CM | POA: Diagnosis not present

## 2021-11-15 DIAGNOSIS — E669 Obesity, unspecified: Secondary | ICD-10-CM | POA: Diagnosis not present

## 2021-11-15 DIAGNOSIS — R946 Abnormal results of thyroid function studies: Secondary | ICD-10-CM | POA: Diagnosis not present

## 2021-11-15 DIAGNOSIS — Z1389 Encounter for screening for other disorder: Secondary | ICD-10-CM | POA: Diagnosis not present

## 2021-12-27 ENCOUNTER — Ambulatory Visit (INDEPENDENT_AMBULATORY_CARE_PROVIDER_SITE_OTHER): Payer: Medicaid Other | Admitting: Cardiology

## 2021-12-27 ENCOUNTER — Ambulatory Visit: Payer: Self-pay

## 2021-12-27 ENCOUNTER — Encounter: Payer: Self-pay | Admitting: Cardiology

## 2021-12-27 VITALS — BP 138/72 | HR 83 | Ht 65.0 in | Wt 253.2 lb

## 2021-12-27 DIAGNOSIS — R011 Cardiac murmur, unspecified: Secondary | ICD-10-CM | POA: Diagnosis not present

## 2021-12-27 DIAGNOSIS — R002 Palpitations: Secondary | ICD-10-CM

## 2021-12-27 DIAGNOSIS — R030 Elevated blood-pressure reading, without diagnosis of hypertension: Secondary | ICD-10-CM

## 2021-12-27 NOTE — Patient Instructions (Addendum)
Medication Instructions:  Your physician recommends that you continue on your current medications as directed. Please refer to the Current Medication list given to you today.  *If you need a refill on your cardiac medications before your next appointment, please call your pharmacy*  Testing/Procedures: Your physician has requested that you have a bubble study echocardiogram. Echocardiography is a painless test that uses sound waves to create images of your heart. It provides your doctor with information about the size and shape of your heart and how well your heart's chambers and valves are working. This procedure takes approximately one hour. There are no restrictions for this procedure.   Your provider has ordered a heart monitor to wear for 14 days. This will be mailed to your home with instructions on placement. Once you have finished the time frame requested, you will return monitor in box provided.     Follow-Up: At [facility], you and your health needs are our priority.  As part of our continuing mission to provide you with exceptional heart care, we have created designated Provider Care Teams.  These Care Teams include your primary Cardiologist (physician) and Advanced Practice Providers (APPs -  Physician Assistants and Nurse Practitioners) who all work together to provide you with the care you need, when you need it.  We recommend signing up for the patient portal called "MyChart".  Sign up information is provided on this After Visit Summary.  MyChart is used to connect with patients for Virtual Visits (Telemedicine).  Patients are able to view lab/test results, encounter notes, upcoming appointments, etc.  Non-urgent messages can be sent to your provider as well.   To learn more about what you can do with MyChart, go to [email or web address].    Your next appointment:   6 week(s)  The format for your next appointment:   In Person  Provider:   Glenetta Hew, MD   Important  Information About Sugar

## 2021-12-27 NOTE — Progress Notes (Unsigned)
3   Primary Care Provider: Pcp, No [facility] Cardiologist: None Electrophysiologist: None  Clinic Note: No chief complaint on file.   ===================================  ASSESSMENT/PLAN   Problem List Items Addressed This Visit     Elevated blood pressure reading - Primary   Palpitations (Chronic)   Heart murmur on physical examination (Chronic)    ===================================  HPI:    Betty Cabrera is a 21 y.o. female who is being seen today for the evaluation of Atypical Chest Pain at the request of Erma Heritage, MD.  Recent Hospitalizations: . 10/09/2021-[facility] ER: Presented with sensation of pins-and-needles in the left side of her chest and left upper arm.  Also noted muscles in her head twitching followed by nausea.  Exam was benign labs were normal.  Pain very atypical in nature.  Recommended PCP follow-up for elevated BP (thought to be related to whitecoat syndrome).  EKG was normal  Dorinda Stehr was seen on ***    Reviewed  CV studies:    The following studies were reviewed today: (if available, images/films reviewed: From Epic Chart or Care Everywhere) ***:   Interval History:   Alyza Artiaga   CV Review of Symptoms (Summary) Cardiovascular ROS: {roscv:310661}  REVIEWED OF SYSTEMS   ROS  I have reviewed and (if needed) personally updated the patient's problem list, medications, allergies, past medical and surgical history, social and family history.   PAST MEDICAL HISTORY   Past Medical History:  Diagnosis Date   Anemia    Asthma    Mental disorder     PAST SURGICAL HISTORY   Past Surgical History:  Procedure Laterality Date   NO PAST SURGERIES      Immunization History  Administered Date(s) Administered   DTaP 06/10/2002, 08/09/2002, 10/13/2002, 10/21/2003, 04/08/2006   HPV 9-valent 10/04/2015   HPV Quadrivalent 06/10/2013, 01/10/2014   Hepatitis A 02/04/2011, 09/03/2011   Hepatitis B  06/10/2002, 01/11/2003, 04/19/2003   HiB (PRP-OMP) 06/10/2002, 08/09/2002, 10/13/2002, 07/11/2003   IPV 06/10/2002, 08/09/2002, 10/21/2003, 04/08/2006   Influenza,inj,Quad PF,6+ Mos 10/04/2015   Influenza-Unspecified 07/05/2011, 06/23/2012, 06/10/2013   MMR 04/19/2003, 04/08/2006   Meningococcal Conjugate 06/10/2013   Pneumococcal Conjugate-13 06/10/2002, 08/09/2002, 04/19/2003, 07/11/2003   Tdap 06/23/2012   Varicella 04/19/2003, 04/08/2006    MEDICATIONS/ALLERGIES   No outpatient medications have been marked as taking for the 12/27/21 encounter (Office Visit) with Leonie Man, MD.    No Known Allergies  SOCIAL HISTORY/FAMILY HISTORY   Reviewed in Epic:   Social History   Tobacco Use   Smoking status: Never   Smokeless tobacco: Never  Vaping Use   Vaping Use: Never used  Substance Use Topics   Alcohol use: Never    Alcohol/week: 0.0 standard drinks   Drug use: Never   Social History   Social History Narrative   Not on file   Family History  Problem Relation Age of Onset   High Cholesterol Maternal Grandmother    Diabetes Maternal Grandfather    Hypertension Mother    Diabetes Maternal Uncle     OBJCTIVE -PE, EKG, labs   Wt Readings from Last 3 Encounters:  12/27/21 253 lb 3.2 oz (114.9 kg) (>99 %, Z= 2.54)*  10/08/21 250 lb (113.4 kg) (>99 %, Z= 2.50)*  08/07/21 252 lb 9.6 oz (114.6 kg) (>99 %, Z= 2.51)*   * Growth percentiles are based on CDC (Girls, 2-20 Years) data.    Physical Exam: BP 138/72   Pulse 83   Ht $R'5\' 5"'QH$  (1.651  m)   Wt 253 lb 3.2 oz (114.9 kg)   SpO2 99%   BMI 42.13 kg/m  Physical Exam   Adult ECG Report  Rate: *** ;  Rhythm: {rhythm:17366};   Narrative Interpretation: ***  Recent Labs:  ***  Lab Results  Component Value Date   CHOL 152 10/04/2015   HDL 38 10/04/2015   Lab Results  Component Value Date   CREATININE 0.53 10/08/2021   BUN 9 10/08/2021   NA 138 10/08/2021   K 3.7 10/08/2021   CL 101 10/08/2021   CO2  29 10/08/2021      Latest Ref Rng & Units 10/08/2021   11:17 PM 07/29/2018    9:13 PM  CBC  WBC 4.0 - 10.5 K/uL 11.4   17.5    Hemoglobin 12.0 - 15.0 g/dL 11.2   11.4    Hematocrit 36.0 - 46.0 % 37.2   36.0    Platelets 150 - 400 K/uL 467   435      Lab Results  Component Value Date   HGBA1C 5.6 10/04/2015   Lab Results  Component Value Date   TSH 1.79 10/04/2015    ==================================================  COVID-19 Education: The signs and symptoms of COVID-19 were discussed with the patient and how to seek care for testing (follow up with PCP or arrange E-visit).    I spent a total of *** minutes with the patient spent in direct patient consultation.  Additional time spent with chart review  / charting (studies, outside notes, etc): *** min Total Time: *** min  Current medicines are reviewed at length with the patient today.  (+/- concerns) ***  This visit occurred during the SARS-CoV-2 public health emergency.  Safety protocols were in place, including screening questions prior to the visit, additional usage of staff PPE, and extensive cleaning of exam room while observing appropriate contact time as indicated for disinfecting solutions.  Notice: This dictation was prepared with Dragon dictation along with smart phrase technology. Any transcriptional errors that result from this process are unintentional and may not be corrected upon review.   Studies Ordered:  No orders of the defined types were placed in this encounter.  No orders of the defined types were placed in this encounter.   Patient Instructions / Medication Changes & Studies & Tests Ordered   There are no Patient Instructions on file for this visit.    Glenetta Hew, M.D., M.S. Interventional Cardiologist   Pager # [phone] Phone # [phone] [address] [place], [place] [address]   Thank you for choosing [facility] in [place]!!

## 2021-12-29 ENCOUNTER — Encounter: Payer: Self-pay | Admitting: Cardiology

## 2021-12-29 NOTE — Assessment & Plan Note (Signed)
Seen that she is more complaining of palpitations than actual chest pain or discomfort.  The symptoms you are describing sound like been probably related to intermittent ectopy but could be short bursts of atrial tachycardia/SVT or bigeminy.    She mentioned this murmur and I do hear something unusual exam is possible that she has mitral prolapse.  Plan: Check 14-day Zio patch monitor and 2D echo.

## 2021-12-29 NOTE — Assessment & Plan Note (Signed)
Berry exam is relatively unusual-somewhat limited by body habitus, but there is clearly an extra heart sound there is a midsystolic click or opening snap is difficult to tell but there is also associated murmur.  With her history by report of childhood murmur, I would like to see but there is indeed any pathology.  It could be that she simply has a flow murmur from intermittent menses.  Plan: Check 2D echo

## 2021-12-29 NOTE — Assessment & Plan Note (Signed)
Her blood pressure is little high today and she tells me that she has had a history of the being high intermittently.  This could simply related to stress and anxiety we will reassess symptoms returns.  I would really not want to put a 21 year old on any medications.

## 2022-01-08 DIAGNOSIS — Z1389 Encounter for screening for other disorder: Secondary | ICD-10-CM | POA: Diagnosis not present

## 2022-01-08 DIAGNOSIS — E669 Obesity, unspecified: Secondary | ICD-10-CM | POA: Diagnosis not present

## 2022-01-08 DIAGNOSIS — F410 Panic disorder [episodic paroxysmal anxiety] without agoraphobia: Secondary | ICD-10-CM | POA: Diagnosis not present

## 2022-01-08 DIAGNOSIS — R011 Cardiac murmur, unspecified: Secondary | ICD-10-CM | POA: Diagnosis not present

## 2022-01-08 DIAGNOSIS — D509 Iron deficiency anemia, unspecified: Secondary | ICD-10-CM | POA: Diagnosis not present

## 2022-01-24 DIAGNOSIS — R030 Elevated blood-pressure reading, without diagnosis of hypertension: Secondary | ICD-10-CM | POA: Diagnosis not present

## 2022-01-24 DIAGNOSIS — Z68.41 Body mass index (BMI) pediatric, greater than or equal to 95th percentile for age: Secondary | ICD-10-CM | POA: Diagnosis not present

## 2022-01-30 ENCOUNTER — Other Ambulatory Visit: Payer: Self-pay | Admitting: Cardiology

## 2022-01-30 DIAGNOSIS — R011 Cardiac murmur, unspecified: Secondary | ICD-10-CM

## 2022-01-31 DIAGNOSIS — H5213 Myopia, bilateral: Secondary | ICD-10-CM | POA: Diagnosis not present

## 2022-02-05 ENCOUNTER — Other Ambulatory Visit: Payer: Medicaid Other

## 2022-02-07 ENCOUNTER — Ambulatory Visit: Payer: Medicaid Other | Admitting: Cardiology

## 2022-02-07 ENCOUNTER — Telehealth: Payer: Self-pay | Admitting: Cardiology

## 2022-02-07 NOTE — Telephone Encounter (Signed)
Patient is requesting echo to be scheduled without the bubble study, please place new order. Thanks

## 2022-02-08 NOTE — Telephone Encounter (Signed)
To Dr. Herbie Baltimore to review if ok to proceed with just a complete echo/ no bubble study prior to changing the order.

## 2022-02-09 NOTE — Telephone Encounter (Signed)
I am not sure why she was still without a bubble study.  The occasion for doing a bubble study is because she is young person with a murmur, MR looking for ASD or VSD and bubble study helps with that.  There is no added risk of bubble study.  Just that she has an IV placed.  It is fine to do without, but it leaves questions unanswered.  Bryan Lemma, MD

## 2022-02-11 NOTE — Telephone Encounter (Signed)
I called and spoke with the patient. I have advised her of Dr. Elissa Hefty comments as stated below in relation to her echocardiogram and bubble study. Per the patient, she was just nervous as she has heard that people can have chest pain/ strokes/ feel weird after the test.   I have advised the patient that the bubble study portion of her test does try to rule out potential "holes in the heart" and that the bubbles are very fine bubbles of agitated NS that is injected through an IV. This is not a contrast dye.   I have advised the patient that it is her choice as to whether to proceed with the bubble portion of the test, but we may not get all the information that we could without the bubble portion.   She is advised that a PA/ NP will be present in the room at the time of her exam.  Today she has consented to the echo w/ bubble study, but she has been made aware that she can call and change this to just a complete echo or let the technician know the day of her exam that she does not want to proceed with the bubbles.   The patient voices understanding and is agreeable.  She has already been scheduled for:  Echo w/ bubble: 03/08/22 Dr. Herbie Baltimore: 03/28/22

## 2022-03-08 ENCOUNTER — Ambulatory Visit (INDEPENDENT_AMBULATORY_CARE_PROVIDER_SITE_OTHER): Payer: Medicaid Other

## 2022-03-08 DIAGNOSIS — R011 Cardiac murmur, unspecified: Secondary | ICD-10-CM | POA: Diagnosis not present

## 2022-03-08 HISTORY — PX: TRANSTHORACIC ECHOCARDIOGRAM: SHX275

## 2022-03-08 LAB — ECHOCARDIOGRAM COMPLETE BUBBLE STUDY
AR max vel: 3.97 cm2
AV Area VTI: 3.64 cm2
AV Area mean vel: 3.72 cm2
AV Mean grad: 5 mmHg
AV Peak grad: 10.1 mmHg
Ao pk vel: 1.59 m/s
Area-P 1/2: 3.1 cm2
Calc EF: 61.2 %
S' Lateral: 2.9 cm
Single Plane A2C EF: 60.4 %
Single Plane A4C EF: 59.3 %

## 2022-03-28 ENCOUNTER — Encounter: Payer: Self-pay | Admitting: Cardiology

## 2022-03-28 ENCOUNTER — Ambulatory Visit: Payer: Medicaid Other | Attending: Cardiology | Admitting: Cardiology

## 2022-03-28 ENCOUNTER — Ambulatory Visit: Payer: Medicaid Other | Admitting: Cardiology

## 2022-03-28 NOTE — Progress Notes (Deleted)
Primary Care Provider: Pcp, No [facility] cardiologist: None Electrophysiologist: None  Clinic Note: No chief complaint on file.   ===================================  ASSESSMENT/PLAN   Problem List Items Addressed This Visit     Elevated blood pressure reading (Chronic)   Heart murmur on physical examination (Chronic)   Palpitations - Primary (Chronic)    ===================================  HPI:    Betty Cabrera is a 21 y.o. female with a PMH below who presents today for ***.  Betty Cabrera was last seen on ***  Recent Hospitalizations: ***  Reviewed  CV studies:    The following studies were reviewed today: (if available, images/films reviewed: From Epic Chart or Care Everywhere) TTE 03/08/2022:: EF 60 to 65%.  No RWMA.  Normal RV.  Normal valves.  Normal RAP.  No evidence of intra-atrial shunt with bubble study.  Normal study   Interval History:   Betty Cabrera   CV Review of Symptoms (Summary) Cardiovascular ROS: {roscv:310661}  REVIEWED OF SYSTEMS   ROS  I have reviewed and (if needed) personally updated the patient's problem list, medications, allergies, past medical and surgical history, social and family history.   PAST MEDICAL HISTORY   Past Medical History:  Diagnosis Date   Anemia    Asthma    Mental disorder     PAST SURGICAL HISTORY   Past Surgical History:  Procedure Laterality Date   NO PAST SURGERIES      Immunization History  Administered Date(s) Administered   DTaP 06/10/2002, 08/09/2002, 10/13/2002, 10/21/2003, 04/08/2006   HPV 9-valent 10/04/2015   HPV Quadrivalent 06/10/2013, 01/10/2014   Hepatitis A 02/04/2011, 09/03/2011   Hepatitis B 06/10/2002, 01/11/2003, 04/19/2003   HiB (PRP-OMP) 06/10/2002, 08/09/2002, 10/13/2002, 07/11/2003   IPV 06/10/2002, 08/09/2002, 10/21/2003, 04/08/2006   Influenza,inj,Quad PF,6+ Mos 10/04/2015   Influenza-Unspecified 07/05/2011, 06/23/2012,  06/10/2013   MMR 04/19/2003, 04/08/2006   Meningococcal Conjugate 06/10/2013   Pneumococcal Conjugate-13 06/10/2002, 08/09/2002, 04/19/2003, 07/11/2003   Tdap 06/23/2012   Varicella 04/19/2003, 04/08/2006    MEDICATIONS/ALLERGIES   No outpatient medications have been marked as taking for the 03/28/22 encounter (Appointment) with Leonie Man, MD.    No Known Allergies  SOCIAL HISTORY/FAMILY HISTORY   Reviewed in Epic:  Pertinent findings:  Social History   Tobacco Use   Smoking status: Never   Smokeless tobacco: Never  Vaping Use   Vaping Use: Never used  Substance Use Topics   Alcohol use: Never    Alcohol/week: 0.0 standard drinks of alcohol   Drug use: Never   Social History   Social History Narrative   Not on file    OBJCTIVE -PE, EKG, labs   Wt Readings from Last 3 Encounters:  12/27/21 253 lb 3.2 oz (114.9 kg) (>99 %, Z= 2.54)*  10/08/21 250 lb (113.4 kg) (>99 %, Z= 2.50)*  08/07/21 252 lb 9.6 oz (114.6 kg) (>99 %, Z= 2.51)*   * Growth percentiles are based on CDC (Girls, 2-20 Years) data.    Physical Exam: There were no vitals taken for this visit. Physical Exam   Adult ECG Report  Rate: *** ;  Rhythm: {rhythm:17366};   Narrative Interpretation: ***  Recent Labs:  ***  Lab Results  Component Value Date   CHOL 152 10/04/2015   HDL 38 10/04/2015   Lab Results  Component Value Date   CREATININE 0.53 10/08/2021   BUN 9 10/08/2021   NA 138 10/08/2021   K 3.7 10/08/2021   CL 101 10/08/2021   CO2 29  10/08/2021      Latest Ref Rng & Units 10/08/2021   11:17 PM 07/29/2018    9:13 PM  CBC  WBC 4.0 - 10.5 K/uL 11.4  17.5   Hemoglobin 12.0 - 15.0 g/dL 11.2  11.4   Hematocrit 36.0 - 46.0 % 37.2  36.0   Platelets 150 - 400 K/uL 467  435     Lab Results  Component Value Date   HGBA1C 5.6 10/04/2015   Lab Results  Component Value Date   TSH 1.79 10/04/2015    ================================================== I spent a total of  ***minutes with the patient spent in direct patient consultation.  Additional time spent with chart review  / charting (studies, outside notes, etc): *** min Total Time: *** min  Current medicines are reviewed at length with the patient today.  (+/- concerns) ***  Notice: This dictation was prepared with Dragon dictation along with smart phrase technology. Any transcriptional errors that result from this process are unintentional and may not be corrected upon review.  Studies Ordered:  No orders of the defined types were placed in this encounter.  No orders of the defined types were placed in this encounter.   Patient Instructions / Medication Changes & Studies & Tests Ordered   There are no Patient Instructions on file for this visit.       Leonie Man, MD, MS Glenetta Hew, M.D., M.S. Interventional Cardiologist  [facility]   [address] [place], [place]  [address] [phone]           Fax [phone]    Thank you for choosing [facility] in [place]!!

## 2022-04-20 ENCOUNTER — Other Ambulatory Visit: Payer: Self-pay

## 2022-04-20 ENCOUNTER — Emergency Department
Admission: EM | Admit: 2022-04-20 | Discharge: 2022-04-20 | Disposition: A | Discharge status: Home / Self Care | Payer: Medicaid Other | Attending: Emergency Medicine | Admitting: Emergency Medicine

## 2022-04-20 ENCOUNTER — Encounter: Payer: Self-pay | Admitting: Emergency Medicine

## 2022-04-20 DIAGNOSIS — R208 Other disturbances of skin sensation: Secondary | ICD-10-CM | POA: Insufficient documentation

## 2022-04-20 DIAGNOSIS — T7840XA Allergy, unspecified, initial encounter: Secondary | ICD-10-CM | POA: Diagnosis not present

## 2022-04-20 DIAGNOSIS — T781XXA Other adverse food reactions, not elsewhere classified, initial encounter: Secondary | ICD-10-CM | POA: Insufficient documentation

## 2022-04-20 MED ORDER — HYDROCORTISONE 1 % EX CREA
TOPICAL_CREAM | CUTANEOUS | Status: AC
  Filled 2022-04-20: qty 28

## 2022-04-20 MED ORDER — CETIRIZINE HCL 5 MG/5ML PO SOLN
10.0000 mg | Freq: Once | ORAL | Status: AC
  Administered 2022-04-20: 10 mg via ORAL
  Filled 2022-04-20: qty 10

## 2022-04-20 MED ORDER — DEXAMETHASONE 10 MG/ML FOR PEDIATRIC ORAL USE
10.0000 mg | Freq: Once | INTRAMUSCULAR | Status: AC
  Administered 2022-04-20: 10 mg via ORAL
  Filled 2022-04-20: qty 1

## 2022-04-20 NOTE — Discharge Instructions (Signed)
Try taking over-the-counter cetirizine (Zyrtec) for the next couple of days.  You do not need to try putting anything else on your hands except for a thin layer of the hydrocortisone cream that we have provided.  You can stop using this when the burning stops.  Follow-up with your regular doctor.

## 2022-04-20 NOTE — ED Provider Notes (Signed)
[facility] Provider Note    Event Date/Time   First MD Initiated Contact with Patient 04/20/22 0126     (approximate)   History   Allergic Reaction   HPI  Betty Cabrera is a 21 y.o. female who presents for evaluation of a possible allergic reaction.  She says that she was chopping up jalapenos earlier today around 4 PM which she has never done before.  She was not using gloves.  After period of time she started to feel like her hands were burning.  This has continued for nearly 9 hours in spite of washing her hands and using various home remedies such as rubbing them with canola oil and rubbing alcohol.  She feels like her hands are hot.  This only happened after cutting up the jalapenos, and she felt fine before hand.  She has no rash except that she has 1 small spot on her right ring finger that is not painful but she does not remember seeing it before, although she says it could have been there and she just did not notice it.  She has no chest pain, shortness of breath, nausea, vomiting, abdominal pain, nor dysuria.  She is not feeling lightheaded or dizzy.  She is not wheezing.  She has no other known allergies.  She did not try taking any oral medication at home.     Physical Exam   Triage Vital Signs: ED Triage Vitals  Enc Vitals Group     BP 04/20/22 0118 135/71     Pulse Rate 04/20/22 0118 78     Resp 04/20/22 0118 16     Temp 04/20/22 0118 98.3 F (36.8 C)     Temp Source 04/20/22 0118 Oral     SpO2 04/20/22 0118 98 %     Weight 04/20/22 0119 114 kg (251 lb 5.2 oz)     Height 04/20/22 0119 1.6 m (5\' 3" )     Head Circumference --      Peak Flow --      Pain Score 04/20/22 0119 9     Pain Loc --      Pain Edu? --      Excl. in GC? --     Most recent vital signs: Vitals:   04/20/22 0118 04/20/22 0253  BP: 135/71 133/78  Pulse: 78 81  Resp: 16 18  Temp: 98.3 F (36.8 C) 98.3 F (36.8 C)  SpO2: 98% 98%     General: Awake,  no distress.  No angioedema. CV:  Good peripheral perfusion.  Normal heart sounds. Resp:  Normal effort.  Lungs are clear to auscultation bilaterally.  Speaking easily and comfortably, no stridor, no difficulty handling secretions. Abd:  No distention.  Other:  No erythema on her hands, no swelling.  She has 1 small spot on her palmar aspect of her right ring finger that appears most consistent with a small wart; it does not appear vesicular and does not appear to represent a blister or herpetic lesion, certainly not representative of herpetic whitlow.    ED Results / Procedures / Treatments   Labs (all labs ordered are listed, but only abnormal results are displayed) Labs Reviewed - No data to display   PROCEDURES:  Critical Care performed: No  Procedures   MEDICATIONS ORDERED IN ED: Medications  cetirizine HCl (Zyrtec) 5 MG/5ML solution 10 mg (10 mg Oral Given 04/20/22 0215)  dexamethasone (DECADRON) 10 MG/ML injection for Pediatric ORAL use 10 mg (10 mg  Oral Given 04/20/22 0220)  hydrocortisone cream 1 % ( Topical Given 04/20/22 0216)     IMPRESSION / MDM / ASSESSMENT AND PLAN / ED COURSE  I reviewed the triage vital signs and the nursing notes.                              Differential diagnosis includes, but is not limited to, chemical side effect of capsaicin, allergic reaction, herpetic lesion or infectious process.  Patient's presentation is most consistent with acute, uncomplicated illness.  Patient is in no distress and has normal vital signs.  No visible sign of allergic reaction.  I suspect this is a chemical reaction to the capsaicin.  I told her she will require time.  I thought it might help if we give her some medication so I ordered cetirizine 10 mg p.o., hydrocortisone cream 1% for topical application, and 1 dose of Decadron 10 mg by mouth.  I gave her my usual and customary follow-up recommendations and return precautions.  No indication of anaphylaxis, no need  for additional observation.       FINAL CLINICAL IMPRESSION(S) / ED DIAGNOSES   Final diagnoses:  Allergic reaction, initial encounter  Burning sensation     Rx / DC Orders   ED Discharge Orders     None        Note:  This document was prepared using Dragon voice recognition software and may include unintentional dictation errors.   Hinda Kehr, MD 04/20/22 [phone]

## 2022-04-20 NOTE — ED Triage Notes (Signed)
Pt reports about seven hrs ago she was chopping some green chiles, pt reports she started to feel burning sensation that has not gone away. Pt talks in complete sentences no respiratory distress noted

## 2022-08-10 ENCOUNTER — Emergency Department
Admission: EM | Admit: 2022-08-10 | Discharge: 2022-08-10 | Disposition: A | Discharge status: Home / Self Care | Payer: Medicaid Other | Attending: Emergency Medicine | Admitting: Emergency Medicine

## 2022-08-10 DIAGNOSIS — R109 Unspecified abdominal pain: Secondary | ICD-10-CM | POA: Diagnosis present

## 2022-08-10 DIAGNOSIS — Z1152 Encounter for screening for COVID-19: Secondary | ICD-10-CM | POA: Diagnosis not present

## 2022-08-10 DIAGNOSIS — R1084 Generalized abdominal pain: Secondary | ICD-10-CM | POA: Diagnosis not present

## 2022-08-10 DIAGNOSIS — J45909 Unspecified asthma, uncomplicated: Secondary | ICD-10-CM | POA: Insufficient documentation

## 2022-08-10 LAB — RESP PANEL BY RT-PCR (RSV, FLU A&B, COVID)  RVPGX2
Influenza A by PCR: NEGATIVE
Influenza B by PCR: NEGATIVE
Resp Syncytial Virus by PCR: NEGATIVE
SARS Coronavirus 2 by RT PCR: NEGATIVE

## 2022-08-10 LAB — COMPREHENSIVE METABOLIC PANEL
ALT: 22 U/L (ref 0–44)
AST: 17 U/L (ref 15–41)
Albumin: 4.3 g/dL (ref 3.5–5.0)
Alkaline Phosphatase: 116 U/L (ref 38–126)
Anion gap: 10 (ref 5–15)
BUN: 10 mg/dL (ref 6–20)
CO2: 25 mmol/L (ref 22–32)
Calcium: 9.4 mg/dL (ref 8.9–10.3)
Chloride: 102 mmol/L (ref 98–111)
Creatinine, Ser: 0.66 mg/dL (ref 0.44–1.00)
GFR, Estimated: >60 mL/min (ref >60)
Glucose, Bld: 96 mg/dL (ref 70–99)
Potassium: 4 mmol/L (ref 3.5–5.1)
Sodium: 137 mmol/L (ref 135–145)
Total Bilirubin: 0.4 mg/dL (ref 0.3–1.2)
Total Protein: 8.2 g/dL — ABNORMAL HIGH (ref 6.5–8.1)

## 2022-08-10 LAB — URINALYSIS, ROUTINE W REFLEX MICROSCOPIC
Bilirubin Urine: NEGATIVE
Glucose, UA: NEGATIVE mg/dL
Hgb urine dipstick: NEGATIVE
Ketones, ur: NEGATIVE mg/dL
Nitrite: NEGATIVE
Protein, ur: NEGATIVE mg/dL
Specific Gravity, Urine: 1.026 (ref 1.005–1.030)
pH: 5 (ref 5.0–8.0)

## 2022-08-10 LAB — CBC
HCT: 36.6 % (ref 36.0–46.0)
Hemoglobin: 11.2 g/dL — ABNORMAL LOW (ref 12.0–15.0)
MCH: 23.5 pg — ABNORMAL LOW (ref 26.0–34.0)
MCHC: 30.6 g/dL (ref 30.0–36.0)
MCV: 76.7 fL — ABNORMAL LOW (ref 80.0–100.0)
Platelets: 401 10*3/uL — ABNORMAL HIGH (ref 150–400)
RBC: 4.77 MIL/uL (ref 3.87–5.11)
RDW: 14.3 % (ref 11.5–15.5)
WBC: 11.1 10*3/uL — ABNORMAL HIGH (ref 4.0–10.5)
nRBC: 0 % (ref 0.0–0.2)

## 2022-08-10 LAB — LIPASE, BLOOD: Lipase: 35 U/L (ref 11–51)

## 2022-08-10 LAB — POC URINE PREG, ED: Preg Test, Ur: NEGATIVE

## 2022-08-10 MED ORDER — ONDANSETRON 4 MG PO TBDP: 4.0000 mg | ORAL_TABLET | Freq: Four times a day (QID) | ORAL | 0 refills | Status: AC | PRN

## 2022-08-10 NOTE — ED Triage Notes (Signed)
Pt states that she has been having body aches, nausea, hot flashes and losing weight over the past week.

## 2022-08-10 NOTE — Discharge Instructions (Addendum)
You may alternate Tylenol 1000 mg every 6 hours as needed for pain, fever and Ibuprofen 800 mg every 6-8 hours as needed for pain, fever.  Please take Ibuprofen with food.  Do not take more than 4000 mg of Tylenol (acetaminophen) in a 24 hour period. ° °

## 2022-08-10 NOTE — ED Provider Notes (Signed)
[facility] Provider Note    Event Date/Time   First MD Initiated Contact with Patient 08/10/22 [phone]     (approximate)   History   Nausea   HPI  Betty Cabrera is a 22 y.o. female history of asthma, anemia, obesity who presents to the emergency department with concerns for generalized bodyaches, intermittent generalized abdominal pain, bloating after eating, unexplained weight loss of 10 pounds in 2 weeks, intermittent nausea, diarrhea x 1 day.  No previous abdominal surgery.  No fever.  No GU symptoms.   History provided by patient and significant other.    Past Medical History:  Diagnosis Date   Anemia    Asthma    Mental disorder     Past Surgical History:  Procedure Laterality Date   NO PAST SURGERIES     TRANSTHORACIC ECHOCARDIOGRAM  03/08/2022   EF 60 to 65%.  No RWMA.  Normal RV.  Normal valves.  Normal RAP.  No evidence of intra-atrial shunt with bubble study.  Normal study    MEDICATIONS:  Prior to Admission medications   Not on File    Physical Exam   Triage Vital Signs: ED Triage Vitals  Enc Vitals Group     BP 08/10/22 0310 (!) 145/88     Pulse Rate 08/10/22 0310 88     Resp 08/10/22 0310 20     Temp 08/10/22 0310 98.1 F (36.7 C)     Temp src --      SpO2 08/10/22 0310 98 %     Weight --      Height --      Head Circumference --      Peak Flow --      Pain Score 08/10/22 0307 0     Pain Loc --      Pain Edu? --      Excl. in [place]? --     Most recent vital signs: Vitals:   08/10/22 0310  BP: (!) 145/88  Pulse: 88  Resp: 20  Temp: 98.1 F (36.7 C)  SpO2: 98%    CONSTITUTIONAL: Alert and oriented and responds appropriately to questions. Well-appearing; well-nourished HEAD: Normocephalic, atraumatic EYES: Conjunctivae clear, pupils appear equal, sclera nonicteric ENT: normal nose; moist mucous membranes NECK: Supple, normal ROM CARD: RRR; S1 and S2 appreciated; no murmurs, no clicks, no rubs, no  gallops RESP: Normal chest excursion without splinting or tachypnea; breath sounds clear and equal bilaterally; no wheezes, no rhonchi, no rales, no hypoxia or respiratory distress, speaking full sentences ABD/GI: Normal bowel sounds; non-distended; soft, mild tenderness diffusely.  No guarding or rebound BACK: The back appears normal EXT: Normal ROM in all joints; no deformity noted, no edema; no cyanosis SKIN: Normal color for age and race; warm; no rash on exposed skin NEURO: Moves all extremities equally, normal speech PSYCH: The patient's mood and manner are appropriate.   ED Results / Procedures / Treatments   LABS: (all labs ordered are listed, but only abnormal results are displayed) Labs Reviewed  COMPREHENSIVE METABOLIC PANEL - Abnormal; Notable for the following components:      Result Value   Total Protein 8.2 (*)    All other components within normal limits  CBC - Abnormal; Notable for the following components:   WBC 11.1 (*)    Hemoglobin 11.2 (*)    MCV 76.7 (*)    MCH 23.5 (*)    Platelets 401 (*)    All other components within normal limits  URINALYSIS, ROUTINE W REFLEX MICROSCOPIC - Abnormal; Notable for the following components:   Color, Urine YELLOW (*)    APPearance HAZY (*)    Leukocytes,Ua MODERATE (*)    Bacteria, UA FEW (*)    All other components within normal limits  RESP PANEL BY RT-PCR (RSV, FLU A&B, COVID)  RVPGX2  LIPASE, BLOOD  POC URINE PREG, ED     EKG:  RADIOLOGY: My personal review and interpretation of imaging:    I have personally reviewed all radiology reports.   No results found.   PROCEDURES:  Critical Care performed: No      Procedures    IMPRESSION / MDM / ASSESSMENT AND PLAN / ED COURSE  I reviewed the triage vital signs and the nursing notes.    Patient here with generalized abdominal pain intermittently for the past 2 weeks.     DIFFERENTIAL DIAGNOSIS (includes but not limited to):   Gastritis, GERD,  cholelithiasis, cholecystitis, pancreatitis, colitis, less likely bowel obstruction, appendicitis, perforation   Patient's presentation is most consistent with acute complicated illness / injury requiring diagnostic workup.   PLAN: Workup initiated from triage.  Patient has slight leukocytosis of 11,000 but this appears chronic.  Stable hemoglobin of 11.  Normal electrolytes, LFTs, lipase.  Urine does show some moderate leukocyte esterase and white blood cells with few bacteria but also many squamous cells.  She has no urinary symptoms so suspect that this is a contaminated sample.  Her pregnancy test is negative.  Given complaints of bodyaches, obtain respiratory viral panel to rule out COVID and flu and these are negative.  Patient declines any pain or nausea medicine at this time.  Will obtain CT of the abdomen pelvis for further evaluation.   MEDICATIONS GIVEN IN ED: Medications - No data to display   ED COURSE: Patient declining CT of the abdomen pelvis given concerns for radiation exposure.  Will give outpatient GI follow-up information.  She also has a PCP.  Recommended Tylenol, ibuprofen for pain control.  Will discharge with Zofran.  COVID, flu and RSV negative.   At this time, I do not feel there is any life-threatening condition present. I reviewed all nursing notes, vitals, pertinent previous records.  All lab and urine results, EKGs, imaging ordered have been independently reviewed and interpreted by myself.  I reviewed all available radiology reports from any imaging ordered this visit.  Based on my assessment, I feel the patient is safe to be discharged home without further emergent workup and can continue workup as an outpatient as needed. Discussed all findings, treatment plan as well as usual and customary return precautions.  They verbalize understanding and are comfortable with this plan.  Outpatient follow-up has been provided as needed.  All questions have been  answered.    CONSULTS:  none   OUTSIDE RECORDS REVIEWED: Reviewed patient's last cardiology note with Dr. Ellyn Hack in June 2023.       FINAL CLINICAL IMPRESSION(S) / ED DIAGNOSES   Final diagnoses:  Generalized abdominal pain     Rx / DC Orders   ED Discharge Orders          Ordered    ondansetron (ZOFRAN-ODT) 4 MG disintegrating tablet  Every 6 hours PRN        08/10/22 0522             Note:  This document was prepared using Dragon voice recognition software and may include unintentional dictation errors.   Kendel Bessey, Delice Bison, DO  08/10/22 0622  

## 2022-08-14 DIAGNOSIS — Z1389 Encounter for screening for other disorder: Secondary | ICD-10-CM | POA: Diagnosis not present

## 2022-08-14 DIAGNOSIS — R350 Frequency of micturition: Secondary | ICD-10-CM | POA: Diagnosis not present

## 2022-08-14 DIAGNOSIS — R1084 Generalized abdominal pain: Secondary | ICD-10-CM | POA: Diagnosis not present

## 2022-08-19 DIAGNOSIS — R946 Abnormal results of thyroid function studies: Secondary | ICD-10-CM | POA: Diagnosis not present

## 2022-08-19 DIAGNOSIS — R1084 Generalized abdominal pain: Secondary | ICD-10-CM | POA: Diagnosis not present

## 2022-08-19 DIAGNOSIS — R197 Diarrhea, unspecified: Secondary | ICD-10-CM | POA: Diagnosis not present

## 2022-08-19 DIAGNOSIS — R739 Hyperglycemia, unspecified: Secondary | ICD-10-CM | POA: Diagnosis not present

## 2022-10-24 DIAGNOSIS — Z1389 Encounter for screening for other disorder: Secondary | ICD-10-CM | POA: Diagnosis not present

## 2022-10-24 DIAGNOSIS — L819 Disorder of pigmentation, unspecified: Secondary | ICD-10-CM | POA: Diagnosis not present

## 2022-10-24 DIAGNOSIS — R748 Abnormal levels of other serum enzymes: Secondary | ICD-10-CM | POA: Diagnosis not present

## 2022-10-24 DIAGNOSIS — Z130 Encounter for screening for diseases of the blood and blood-forming organs and certain disorders involving the immune mechanism: Secondary | ICD-10-CM | POA: Diagnosis not present

## 2022-10-24 DIAGNOSIS — D509 Iron deficiency anemia, unspecified: Secondary | ICD-10-CM | POA: Diagnosis not present

## 2022-10-24 DIAGNOSIS — Z1331 Encounter for screening for depression: Secondary | ICD-10-CM | POA: Diagnosis not present

## 2022-11-12 ENCOUNTER — Telehealth: Payer: Self-pay

## 2022-11-12 NOTE — Telephone Encounter (Signed)
Attempted to contact patient to schedule apt with PCP for chronic condition follow up. LVM to call back. AS, CMA

## 2023-05-14 DIAGNOSIS — Z3201 Encounter for pregnancy test, result positive: Secondary | ICD-10-CM | POA: Diagnosis not present

## 2023-05-14 DIAGNOSIS — Z3200 Encounter for pregnancy test, result unknown: Secondary | ICD-10-CM | POA: Diagnosis not present

## 2023-05-14 DIAGNOSIS — Z1389 Encounter for screening for other disorder: Secondary | ICD-10-CM | POA: Diagnosis not present

## 2023-05-16 DIAGNOSIS — Z3201 Encounter for pregnancy test, result positive: Secondary | ICD-10-CM | POA: Diagnosis not present

## 2023-06-02 DIAGNOSIS — Z3401 Encounter for supervision of normal first pregnancy, first trimester: Secondary | ICD-10-CM | POA: Diagnosis not present

## 2023-06-02 DIAGNOSIS — Z1389 Encounter for screening for other disorder: Secondary | ICD-10-CM | POA: Diagnosis not present

## 2023-06-02 DIAGNOSIS — Z3201 Encounter for pregnancy test, result positive: Secondary | ICD-10-CM | POA: Diagnosis not present

## 2023-06-02 DIAGNOSIS — Z131 Encounter for screening for diabetes mellitus: Secondary | ICD-10-CM | POA: Diagnosis not present

## 2023-06-02 DIAGNOSIS — Z719 Counseling, unspecified: Secondary | ICD-10-CM | POA: Diagnosis not present

## 2023-06-02 DIAGNOSIS — Z1331 Encounter for screening for depression: Secondary | ICD-10-CM | POA: Diagnosis not present

## 2023-06-09 DIAGNOSIS — Z3A08 8 weeks gestation of pregnancy: Secondary | ICD-10-CM | POA: Diagnosis not present

## 2023-06-09 DIAGNOSIS — O3680X0 Pregnancy with inconclusive fetal viability, not applicable or unspecified: Secondary | ICD-10-CM | POA: Diagnosis not present

## 2023-06-15 DIAGNOSIS — O2341 Unspecified infection of urinary tract in pregnancy, first trimester: Secondary | ICD-10-CM | POA: Diagnosis not present

## 2023-06-15 DIAGNOSIS — Z3A09 9 weeks gestation of pregnancy: Secondary | ICD-10-CM | POA: Diagnosis not present

## 2023-06-15 DIAGNOSIS — R110 Nausea: Secondary | ICD-10-CM | POA: Diagnosis not present

## 2023-06-15 DIAGNOSIS — O26891 Other specified pregnancy related conditions, first trimester: Secondary | ICD-10-CM | POA: Diagnosis not present

## 2023-06-15 DIAGNOSIS — J45909 Unspecified asthma, uncomplicated: Secondary | ICD-10-CM | POA: Insufficient documentation

## 2023-06-15 DIAGNOSIS — Z3A01 Less than 8 weeks gestation of pregnancy: Secondary | ICD-10-CM | POA: Diagnosis not present

## 2023-06-15 DIAGNOSIS — R1031 Right lower quadrant pain: Secondary | ICD-10-CM | POA: Diagnosis not present

## 2023-06-16 ENCOUNTER — Emergency Department
Admission: EM | Admit: 2023-06-16 | Discharge: 2023-06-16 | Disposition: A | Discharge status: Home / Self Care | Payer: Medicaid Other | Attending: Emergency Medicine | Admitting: Emergency Medicine

## 2023-06-16 ENCOUNTER — Emergency Department: Payer: Medicaid Other

## 2023-06-16 ENCOUNTER — Other Ambulatory Visit: Payer: Self-pay

## 2023-06-16 DIAGNOSIS — Z3491 Encounter for supervision of normal pregnancy, unspecified, first trimester: Secondary | ICD-10-CM

## 2023-06-16 DIAGNOSIS — R1031 Right lower quadrant pain: Secondary | ICD-10-CM

## 2023-06-16 DIAGNOSIS — N390 Urinary tract infection, site not specified: Secondary | ICD-10-CM

## 2023-06-16 DIAGNOSIS — Z3A09 9 weeks gestation of pregnancy: Secondary | ICD-10-CM | POA: Diagnosis not present

## 2023-06-16 LAB — COMPREHENSIVE METABOLIC PANEL
ALT: 18 U/L (ref 0–44)
AST: 14 U/L — ABNORMAL LOW (ref 15–41)
Albumin: 3.8 g/dL (ref 3.5–5.0)
Alkaline Phosphatase: 96 U/L (ref 38–126)
Anion gap: 10 (ref 5–15)
BUN: 7 mg/dL (ref 6–20)
CO2: 20 mmol/L — ABNORMAL LOW (ref 22–32)
Calcium: 9 mg/dL (ref 8.9–10.3)
Chloride: 106 mmol/L (ref 98–111)
Creatinine, Ser: 0.45 mg/dL (ref 0.44–1.00)
GFR, Estimated: >60 mL/min (ref >60)
Glucose, Bld: 95 mg/dL (ref 70–99)
Potassium: 3.9 mmol/L (ref 3.5–5.1)
Sodium: 136 mmol/L (ref 135–145)
Total Bilirubin: <0.2 mg/dL (ref <1.2)
Total Protein: 7.7 g/dL (ref 6.5–8.1)

## 2023-06-16 LAB — URINALYSIS, ROUTINE W REFLEX MICROSCOPIC
Bilirubin Urine: NEGATIVE
Glucose, UA: NEGATIVE mg/dL
Hgb urine dipstick: NEGATIVE
Ketones, ur: NEGATIVE mg/dL
Nitrite: NEGATIVE
Protein, ur: NEGATIVE mg/dL
Specific Gravity, Urine: 1.027 (ref 1.005–1.030)
pH: 5 (ref 5.0–8.0)

## 2023-06-16 LAB — CBC
HCT: 34.7 % — ABNORMAL LOW (ref 36.0–46.0)
Hemoglobin: 11 g/dL — ABNORMAL LOW (ref 12.0–15.0)
MCH: 23.9 pg — ABNORMAL LOW (ref 26.0–34.0)
MCHC: 31.7 g/dL (ref 30.0–36.0)
MCV: 75.3 fL — ABNORMAL LOW (ref 80.0–100.0)
Platelets: 410 10*3/uL — ABNORMAL HIGH (ref 150–400)
RBC: 4.61 MIL/uL (ref 3.87–5.11)
RDW: 15.5 % (ref 11.5–15.5)
WBC: 13.9 10*3/uL — ABNORMAL HIGH (ref 4.0–10.5)
nRBC: 0 % (ref 0.0–0.2)

## 2023-06-16 LAB — POC URINE PREG, ED: Preg Test, Ur: POSITIVE — AB

## 2023-06-16 LAB — ABO/RH: ABO/RH(D): A POS

## 2023-06-16 LAB — HCG, QUANTITATIVE, PREGNANCY: hCG, Beta Chain, Quant, S: 137335 m[IU]/mL — ABNORMAL HIGH (ref <5)

## 2023-06-16 LAB — LIPASE, BLOOD: Lipase: 28 U/L (ref 11–51)

## 2023-06-16 MED ORDER — NITROFURANTOIN MONOHYD MACRO 100 MG PO CAPS: 100.0000 mg | ORAL_CAPSULE | Freq: Two times a day (BID) | ORAL | 0 refills | Status: AC

## 2023-06-16 MED ORDER — NITROFURANTOIN MONOHYD MACRO 100 MG PO CAPS
100.0000 mg | ORAL_CAPSULE | Freq: Once | ORAL | Status: AC
  Administered 2023-06-16: 100 mg via ORAL
  Filled 2023-06-16: qty 1

## 2023-06-16 NOTE — Discharge Instructions (Signed)
Please take and finish antibiotic as prescribed.  Please return to the ER immediately for worsening symptoms, persistent vomiting, fever or other concerns.

## 2023-06-16 NOTE — ED Triage Notes (Signed)
Pt reports RLQ abdominal pain that started approx 3 days ago. Pt reports she is approx [redacted] weeks pregnant with LMP on 9/16. Pt denies any vaginal bleeding or other complaints.

## 2023-06-16 NOTE — ED Provider Notes (Signed)
[facility] Provider Note    Event Date/Time   First MD Initiated Contact with Patient 06/16/23 [phone]     (approximate)   History   Abdominal Pain   HPI  Betty Cabrera is a 22 y.o. female G1 P0 approximately [redacted] weeks pregnant by dates who presents to the ER from home with a 3-day history of right lower quadrant abdominal pain, waxing/waning with occasional nausea.  Denies associated fever/chills, chest pain, shortness of breath, vomiting, vaginal bleeding/discharge or dysuria.     Past Medical History   Past Medical History:  Diagnosis Date   Anemia    Asthma    Mental disorder      Active Problem List   Patient Active Problem List   Diagnosis Date Noted   Palpitations 12/27/2021   Heart murmur on physical examination 12/27/2021   Vitamin D deficiency 11/13/2015   Allergic rhinitis due to pollen 11/13/2015   Elevated blood pressure reading 10/04/2015   Obesity, pediatric, BMI 95th to 98th percentile for age 42/02/2016   Mild intermittent asthma 10/04/2015     Past Surgical History   Past Surgical History:  Procedure Laterality Date   NO PAST SURGERIES     TRANSTHORACIC ECHOCARDIOGRAM  03/08/2022   EF 60 to 65%.  No RWMA.  Normal RV.  Normal valves.  Normal RAP.  No evidence of intra-atrial shunt with bubble study.  Normal study     Home Medications   Prior to Admission medications   Medication Sig Start Date End Date Taking? Authorizing Provider  nitrofurantoin, macrocrystal-monohydrate, (MACROBID) 100 MG capsule Take 1 capsule (100 mg total) by mouth 2 (two) times daily. 06/16/23  Yes Irean Hong, MD  ondansetron (ZOFRAN-ODT) 4 MG disintegrating tablet Take 1 tablet (4 mg total) by mouth every 6 (six) hours as needed for nausea or vomiting. 08/10/22   Ward, Layla Maw, DO     Allergies  Patient has no known allergies.   Family History   Family History  Problem Relation Age of Onset   High Cholesterol Maternal  Grandmother    Diabetes Maternal Grandfather    Hypertension Mother    Diabetes Maternal Uncle      Physical Exam  Triage Vital Signs: ED Triage Vitals  Encounter Vitals Group     BP 06/16/23 0008 (!) 147/91     Systolic BP Percentile --      Diastolic BP Percentile --      Pulse Rate 06/16/23 0008 94     Resp 06/16/23 0008 17     Temp 06/16/23 0008 99 F (37.2 C)     Temp Source 06/16/23 0008 Oral     SpO2 06/16/23 0008 100 %     Weight --      Height --      Head Circumference --      Peak Flow --      Pain Score --      Pain Loc --      Pain Education --      Exclude from Growth Chart --     Updated Vital Signs: BP 134/83   Pulse 94   Temp 99 F (37.2 C) (Oral)   Resp 17   LMP 04/14/2023 (Approximate)   SpO2 100%   Breastfeeding Unknown    General: Awake, no distress.  CV:  RRR.  Good peripheral perfusion.  Resp:  Normal effort.  CTAB. Abd:  Mild tenderness to right lower quadrant without rebound or guarding.  No distention.  Other:  No truncal vesicles.   ED Results / Procedures / Treatments  Labs (all labs ordered are listed, but only abnormal results are displayed) Labs Reviewed  COMPREHENSIVE METABOLIC PANEL - Abnormal; Notable for the following components:      Result Value   CO2 20 (*)    AST 14 (*)    All other components within normal limits  CBC - Abnormal; Notable for the following components:   WBC 13.9 (*)    Hemoglobin 11.0 (*)    HCT 34.7 (*)    MCV 75.3 (*)    MCH 23.9 (*)    Platelets 410 (*)    All other components within normal limits  URINALYSIS, ROUTINE W REFLEX MICROSCOPIC - Abnormal; Notable for the following components:   Color, Urine YELLOW (*)    APPearance HAZY (*)    Leukocytes,Ua SMALL (*)    Bacteria, UA RARE (*)    All other components within normal limits  HCG, QUANTITATIVE, PREGNANCY - Abnormal; Notable for the following components:   hCG, Beta Chain, Quant, S 841,660 (*)    All other components within normal  limits  POC URINE PREG, ED - Abnormal; Notable for the following components:   Preg Test, Ur POSITIVE (*)    All other components within normal limits  LIPASE, BLOOD  ABO/RH     EKG  None   RADIOLOGY I have independently visualized and interpreted patient's imaging studies as well as noted the radiology interpretation:  Ultrasound: IUP 9-week 2-day  Official radiology report(s): US OB Comp Less 14 Wks  Result Date: 06/16/2023 CLINICAL DATA:  Pelvic pain and cramping EXAM: OBSTETRIC <14 WK ULTRASOUND TECHNIQUE: Transabdominal ultrasound was performed for evaluation of the gestation as well as the maternal uterus and adnexal regions. COMPARISON:  None Available. FINDINGS: Intrauterine gestational sac: Present Yolk sac:  Present Embryo:  Present Cardiac Activity: Present Heart Rate: 169 bpm CRL:   25.4 mm   9 w 2 d                  [place] EDC: 01/17/2024 Subchorionic hemorrhage:  None visualized. Maternal uterus/adnexae: Ovaries appear within normal limits. No free fluid is noted. IMPRESSION: Single live intrauterine gestation at 9 weeks 2 days. Electronically Signed   By: Alcide Clever M.D.   On: 06/16/2023 01:22     PROCEDURES:  Critical Care performed: No  Procedures   MEDICATIONS ORDERED IN ED: Medications  nitrofurantoin (macrocrystal-monohydrate) (MACROBID) capsule 100 mg (has no administration in time range)     IMPRESSION / MDM / ASSESSMENT AND PLAN / ED COURSE  I reviewed the triage vital signs and the nursing notes.                             22 year old G1, P1 approximately [redacted] weeks pregnant presenting with right lower quadrant abdominal pain. Differential diagnosis includes, but is not limited to, ovarian cyst, ovarian torsion, acute appendicitis, diverticulitis, urinary tract infection/pyelonephritis, endometriosis, bowel obstruction, colitis, renal colic, gastroenteritis, hernia, fibroids, endometriosis, pregnancy related pain including ectopic pregnancy, etc. I have  personally reviewed patient's records and note an OB telephone communication from 06/05/2023  Patient's presentation is most consistent with acute presentation with potential threat to life or bodily function.  Laboratory results notable for leukocytosis with WBC almost 14, small leukocyte positive UTI.  Ultrasound negative.  Discussed with patient and recommend MRI abdomen to rule out appendicitis.  Patient  does not think she needs MRI, thinks pain is due to constipation.  Does not desire MRI this morning.  I did discuss with patient if she does have appendicitis that she is at risk for rupture, sepsis and/or permanent disability or death to her and her fetus.  Patient does promise to return if her symptoms worsen.  Strict return precautions given.  Patient and family member verbalize understanding and agrees with plan of care.      FINAL CLINICAL IMPRESSION(S) / ED DIAGNOSES   Final diagnoses:  Right lower quadrant abdominal pain  Lower urinary tract infectious disease  First trimester pregnancy     Rx / DC Orders   ED Discharge Orders          Ordered    nitrofurantoin, macrocrystal-monohydrate, (MACROBID) 100 MG capsule  2 times daily        06/16/23 0604             Note:  This document was prepared using Dragon voice recognition software and may include unintentional dictation errors.   Irean Hong, MD 06/16/23 [phone]

## 2023-07-04 IMAGING — CR DG CHEST 2V
1 series · 2 of 2 positions shown · non-contrast
Comparison: 07/29/2018

CLINICAL DATA: Mid chest pain, shortness of breath starting a few
hours ago.

EXAM:
CHEST - 2 VIEW

[Series 2: w chest pa · 0.14mm/px · 2 of 2 slices shown]
[im 1/2]
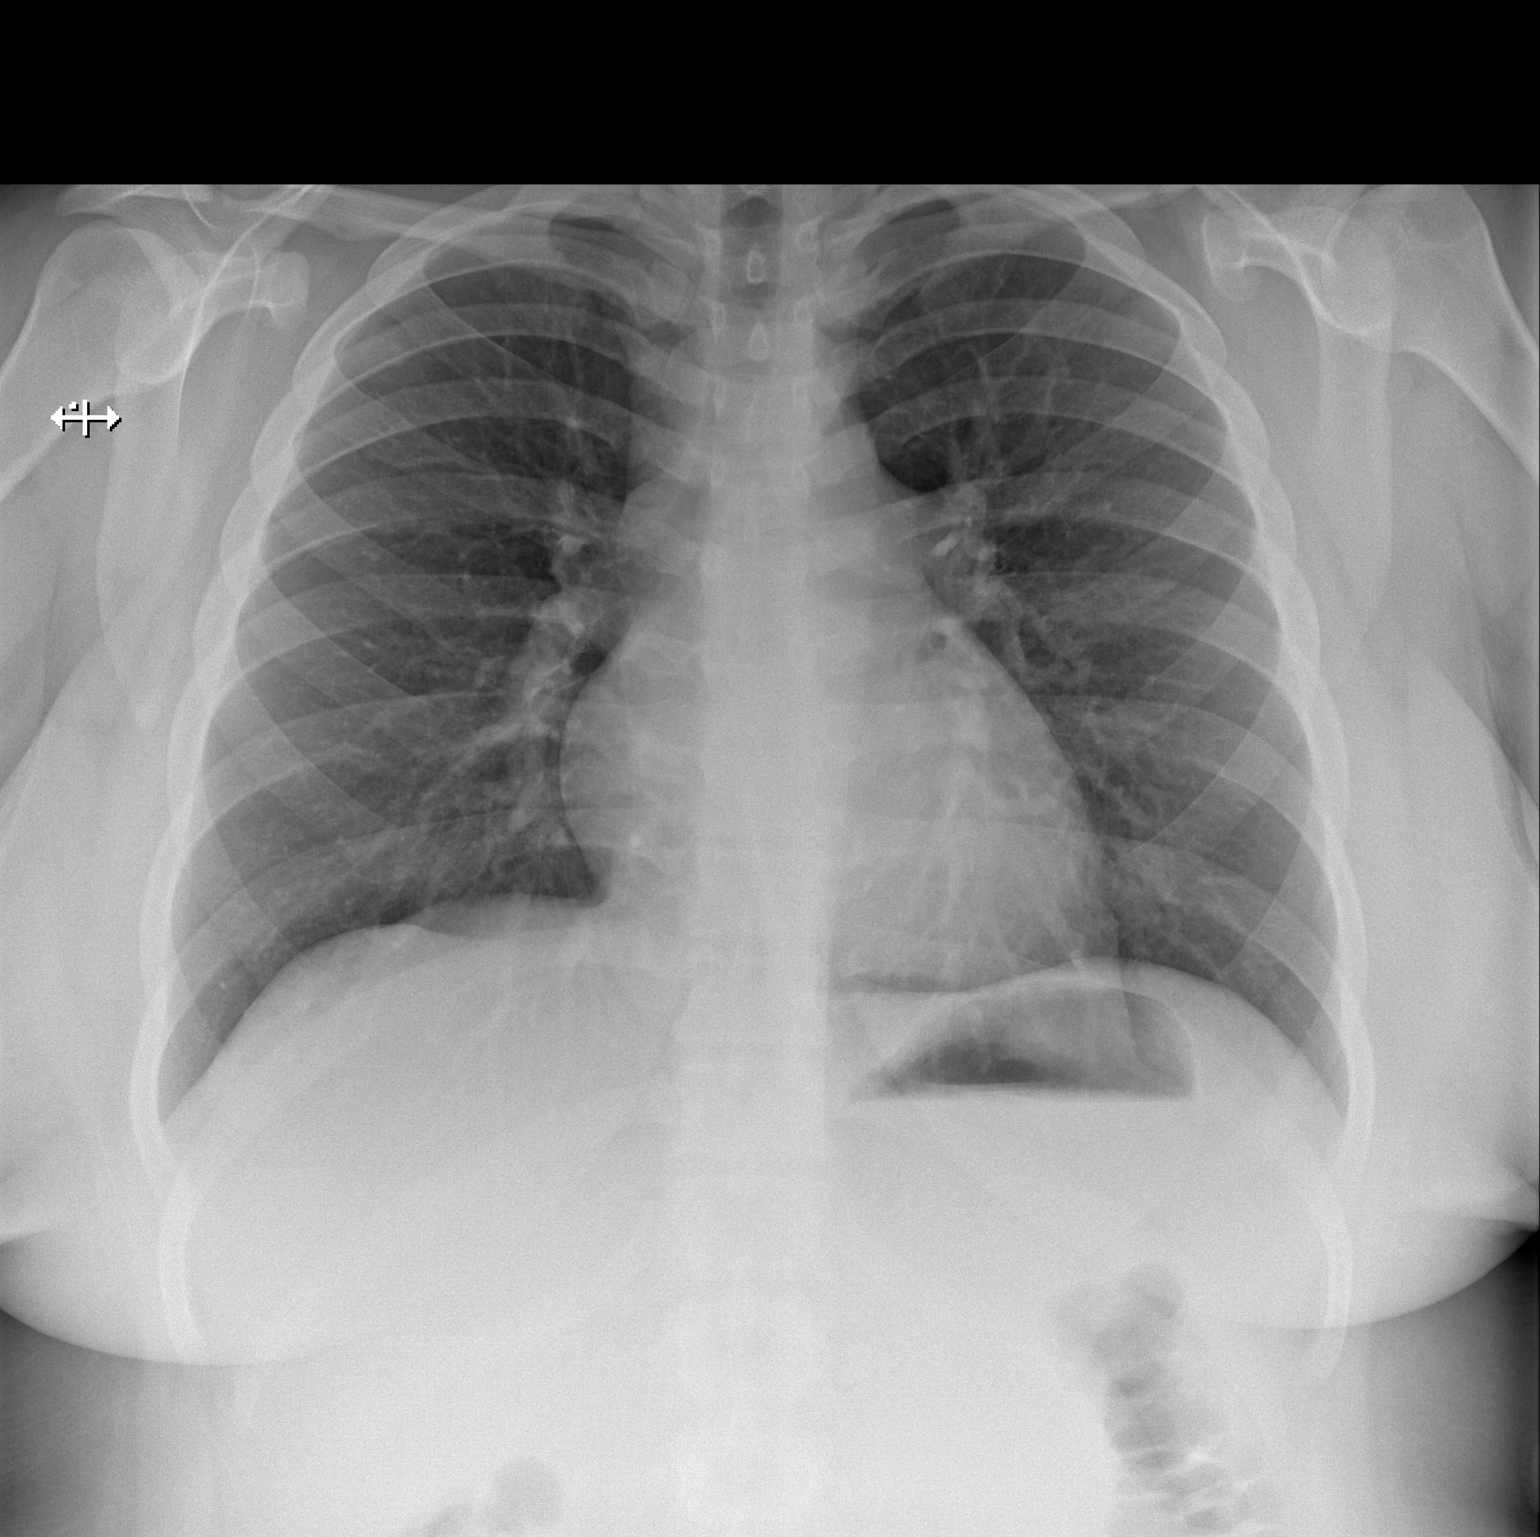
[im 2/2]
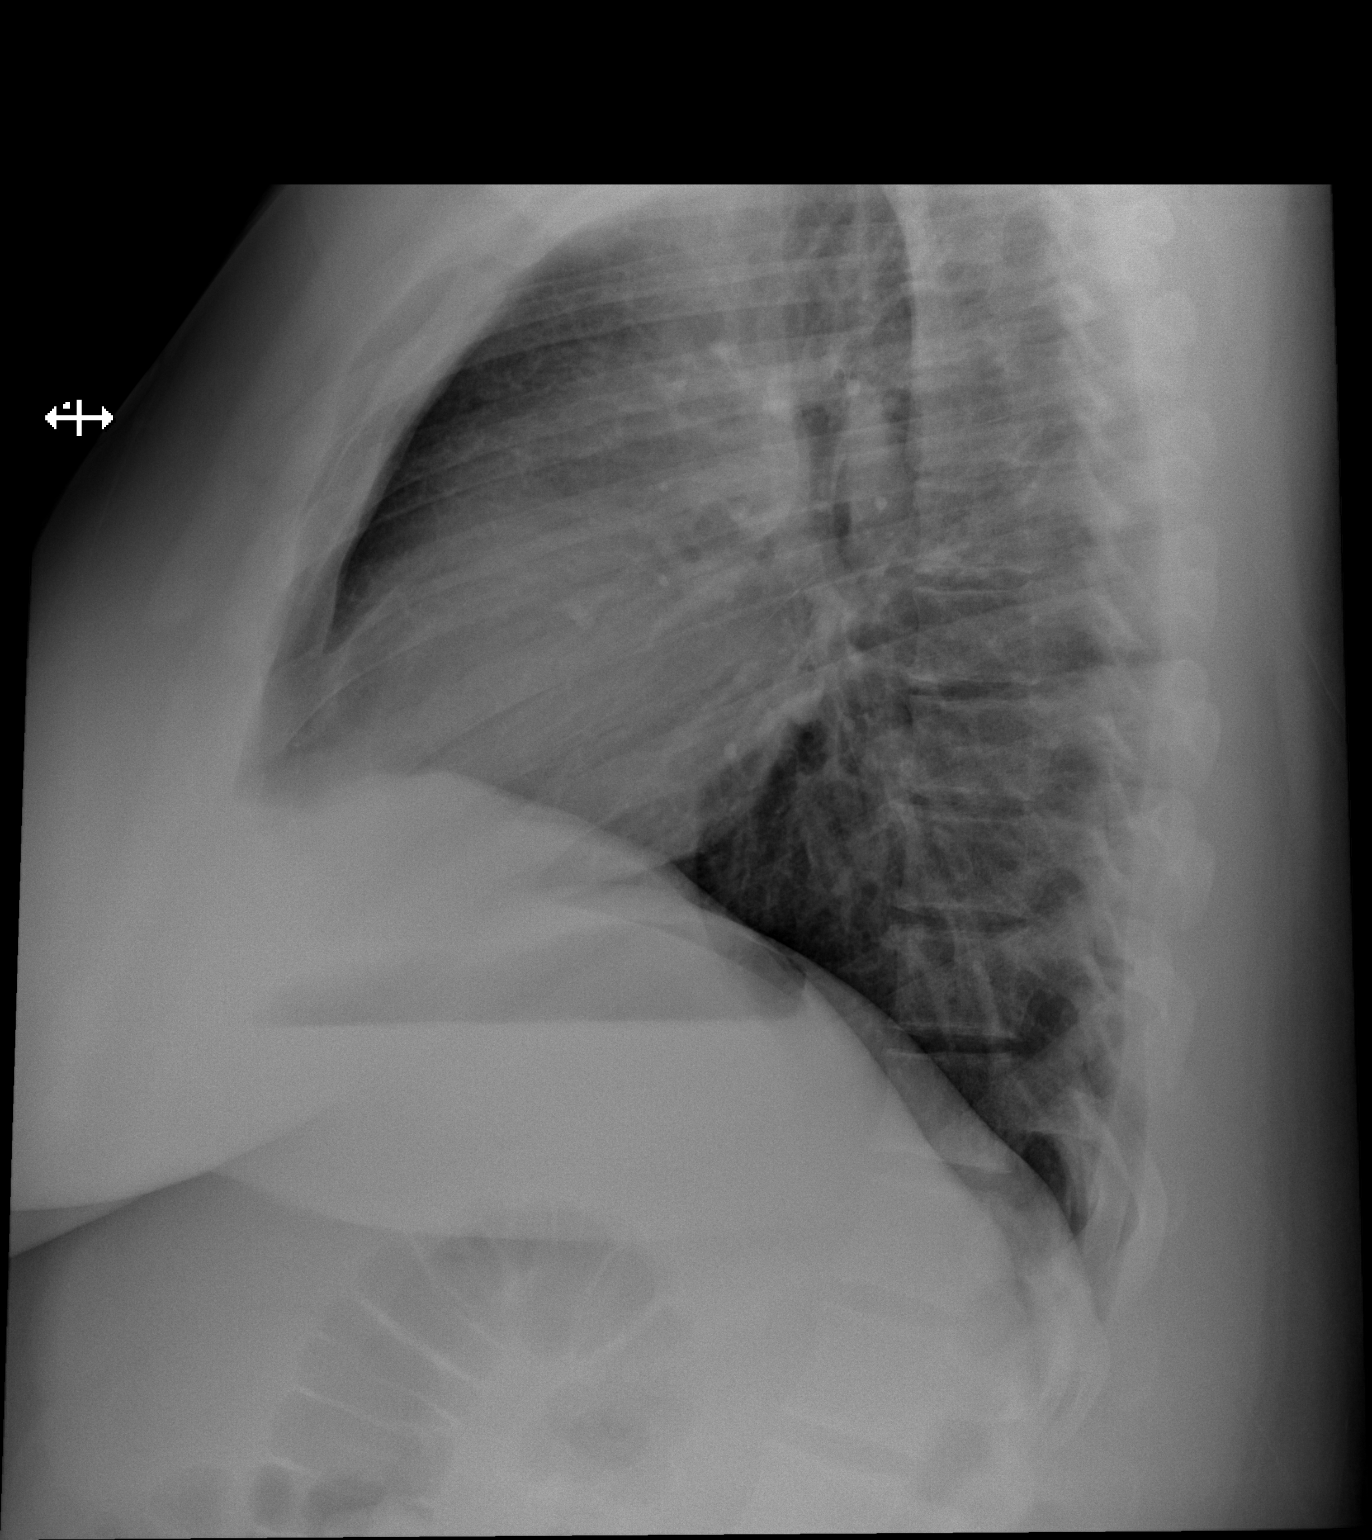

[2 of 2 positions shown; findings below may reference images not displayed]

FINDINGS: The heart size and mediastinal contours are within normal limits.
Both lungs are clear. The visualized skeletal structures are
unremarkable.
IMPRESSION: No active cardiopulmonary disease.

## 2023-07-08 DIAGNOSIS — Z3401 Encounter for supervision of normal first pregnancy, first trimester: Secondary | ICD-10-CM | POA: Diagnosis not present

## 2023-07-17 DIAGNOSIS — Z1389 Encounter for screening for other disorder: Secondary | ICD-10-CM | POA: Diagnosis not present

## 2023-07-17 DIAGNOSIS — J189 Pneumonia, unspecified organism: Secondary | ICD-10-CM | POA: Diagnosis not present

## 2023-07-17 DIAGNOSIS — N898 Other specified noninflammatory disorders of vagina: Secondary | ICD-10-CM | POA: Diagnosis not present

## 2023-07-28 DIAGNOSIS — Z5321 Procedure and treatment not carried out due to patient leaving prior to being seen by health care provider: Secondary | ICD-10-CM | POA: Diagnosis not present

## 2023-07-28 DIAGNOSIS — J020 Streptococcal pharyngitis: Secondary | ICD-10-CM | POA: Diagnosis not present

## 2023-07-28 DIAGNOSIS — Z20822 Contact with and (suspected) exposure to covid-19: Secondary | ICD-10-CM | POA: Diagnosis not present

## 2023-07-28 DIAGNOSIS — J029 Acute pharyngitis, unspecified: Secondary | ICD-10-CM | POA: Diagnosis present

## 2023-07-28 DIAGNOSIS — R1030 Lower abdominal pain, unspecified: Secondary | ICD-10-CM | POA: Diagnosis not present

## 2023-07-29 ENCOUNTER — Other Ambulatory Visit: Payer: Self-pay

## 2023-07-29 ENCOUNTER — Emergency Department
Admission: EM | Admit: 2023-07-29 | Discharge: 2023-07-29 | Discharge status: Against Medical Advice | Payer: Medicaid Other | Attending: Emergency Medicine | Admitting: Emergency Medicine

## 2023-07-29 LAB — RESP PANEL BY RT-PCR (RSV, FLU A&B, COVID)  RVPGX2
Influenza A by PCR: NEGATIVE
Influenza B by PCR: NEGATIVE
Resp Syncytial Virus by PCR: NEGATIVE
SARS Coronavirus 2 by RT PCR: NEGATIVE

## 2023-07-29 LAB — GROUP A STREP BY PCR: Group A Strep by PCR: DETECTED — AB

## 2023-07-29 LAB — BASIC METABOLIC PANEL
Anion gap: 9 (ref 5–15)
BUN: 5 mg/dL — ABNORMAL LOW (ref 6–20)
CO2: 21 mmol/L — ABNORMAL LOW (ref 22–32)
Calcium: 9 mg/dL (ref 8.9–10.3)
Chloride: 104 mmol/L (ref 98–111)
Creatinine, Ser: 0.6 mg/dL (ref 0.44–1.00)
GFR, Estimated: >60 mL/min (ref >60)
Glucose, Bld: 87 mg/dL (ref 70–99)
Potassium: 3.9 mmol/L (ref 3.5–5.1)
Sodium: 134 mmol/L — ABNORMAL LOW (ref 135–145)

## 2023-07-29 LAB — CBC
HCT: 35.7 % — ABNORMAL LOW (ref 36.0–46.0)
Hemoglobin: 11.3 g/dL — ABNORMAL LOW (ref 12.0–15.0)
MCH: 24.1 pg — ABNORMAL LOW (ref 26.0–34.0)
MCHC: 31.7 g/dL (ref 30.0–36.0)
MCV: 76.1 fL — ABNORMAL LOW (ref 80.0–100.0)
Platelets: 383 10*3/uL (ref 150–400)
RBC: 4.69 MIL/uL (ref 3.87–5.11)
RDW: 15.8 % — ABNORMAL HIGH (ref 11.5–15.5)
WBC: 13.3 10*3/uL — ABNORMAL HIGH (ref 4.0–10.5)
nRBC: 0 % (ref 0.0–0.2)

## 2023-07-29 LAB — HCG, QUANTITATIVE, PREGNANCY: hCG, Beta Chain, Quant, S: 48990 m[IU]/mL — ABNORMAL HIGH (ref <5)

## 2023-07-29 NOTE — ED Triage Notes (Signed)
 Pt to ED via POV c/o lower abd cramping, and sore throat. Pt recently told she has yeast infection. Has been cramping for a couple days. Pt reports seeing white patches in back of throat and sore throat x2 days, thinks it is yeast. Pt has also had cough congestion x2 weeks.

## 2023-07-29 NOTE — ED Notes (Signed)
No answer when called several times from lobby 

## 2023-07-31 ENCOUNTER — Telehealth: Payer: Self-pay | Admitting: Emergency Medicine

## 2023-07-31 DIAGNOSIS — Z1389 Encounter for screening for other disorder: Secondary | ICD-10-CM | POA: Diagnosis not present

## 2023-07-31 DIAGNOSIS — Z3401 Encounter for supervision of normal first pregnancy, first trimester: Secondary | ICD-10-CM | POA: Diagnosis not present

## 2023-07-31 DIAGNOSIS — O4692 Antepartum hemorrhage, unspecified, second trimester: Secondary | ICD-10-CM | POA: Diagnosis not present

## 2023-07-31 NOTE — Telephone Encounter (Signed)
 Called patient due to left emergency department before provider exam to inquire about condition and follow up plans. I asked her to call pcp today and get treated for the strep.  She agrees.

## 2023-08-03 DIAGNOSIS — O99891 Other specified diseases and conditions complicating pregnancy: Secondary | ICD-10-CM | POA: Diagnosis not present

## 2023-08-03 DIAGNOSIS — Z3A16 16 weeks gestation of pregnancy: Secondary | ICD-10-CM | POA: Diagnosis not present

## 2023-08-03 DIAGNOSIS — J020 Streptococcal pharyngitis: Secondary | ICD-10-CM | POA: Diagnosis not present

## 2023-08-03 DIAGNOSIS — O4693 Antepartum hemorrhage, unspecified, third trimester: Secondary | ICD-10-CM | POA: Diagnosis not present

## 2023-08-04 DIAGNOSIS — O4692 Antepartum hemorrhage, unspecified, second trimester: Secondary | ICD-10-CM | POA: Diagnosis not present

## 2023-08-04 DIAGNOSIS — Z3689 Encounter for other specified antenatal screening: Secondary | ICD-10-CM | POA: Diagnosis not present

## 2023-08-04 DIAGNOSIS — Z3A16 16 weeks gestation of pregnancy: Secondary | ICD-10-CM | POA: Diagnosis not present

## 2023-08-21 DIAGNOSIS — Z13220 Encounter for screening for lipoid disorders: Secondary | ICD-10-CM | POA: Diagnosis not present

## 2023-08-21 DIAGNOSIS — R1084 Generalized abdominal pain: Secondary | ICD-10-CM | POA: Diagnosis not present

## 2023-08-21 DIAGNOSIS — Z3401 Encounter for supervision of normal first pregnancy, first trimester: Secondary | ICD-10-CM | POA: Diagnosis not present

## 2023-08-21 DIAGNOSIS — Z3201 Encounter for pregnancy test, result positive: Secondary | ICD-10-CM | POA: Diagnosis not present

## 2023-08-21 DIAGNOSIS — E669 Obesity, unspecified: Secondary | ICD-10-CM | POA: Diagnosis not present

## 2023-08-21 DIAGNOSIS — L819 Disorder of pigmentation, unspecified: Secondary | ICD-10-CM | POA: Diagnosis not present

## 2023-08-21 DIAGNOSIS — Z131 Encounter for screening for diabetes mellitus: Secondary | ICD-10-CM | POA: Diagnosis not present

## 2023-08-21 DIAGNOSIS — Z1159 Encounter for screening for other viral diseases: Secondary | ICD-10-CM | POA: Diagnosis not present

## 2023-08-21 DIAGNOSIS — Z113 Encounter for screening for infections with a predominantly sexual mode of transmission: Secondary | ICD-10-CM | POA: Diagnosis not present

## 2023-08-22 DIAGNOSIS — Z3A19 19 weeks gestation of pregnancy: Secondary | ICD-10-CM | POA: Diagnosis not present

## 2023-08-22 DIAGNOSIS — O99212 Obesity complicating pregnancy, second trimester: Secondary | ICD-10-CM | POA: Diagnosis not present

## 2023-08-22 DIAGNOSIS — Z3689 Encounter for other specified antenatal screening: Secondary | ICD-10-CM | POA: Diagnosis not present

## 2023-09-02 DIAGNOSIS — Z1331 Encounter for screening for depression: Secondary | ICD-10-CM | POA: Diagnosis not present

## 2023-09-02 DIAGNOSIS — Z1389 Encounter for screening for other disorder: Secondary | ICD-10-CM | POA: Diagnosis not present

## 2023-09-02 DIAGNOSIS — Z719 Counseling, unspecified: Secondary | ICD-10-CM | POA: Diagnosis not present

## 2023-09-02 DIAGNOSIS — Z3402 Encounter for supervision of normal first pregnancy, second trimester: Secondary | ICD-10-CM | POA: Diagnosis not present

## 2023-09-22 DIAGNOSIS — Z362 Encounter for other antenatal screening follow-up: Secondary | ICD-10-CM | POA: Diagnosis not present

## 2023-09-22 DIAGNOSIS — Z3A23 23 weeks gestation of pregnancy: Secondary | ICD-10-CM | POA: Diagnosis not present

## 2023-09-22 DIAGNOSIS — O99212 Obesity complicating pregnancy, second trimester: Secondary | ICD-10-CM | POA: Diagnosis not present

## 2023-10-03 DIAGNOSIS — Z1331 Encounter for screening for depression: Secondary | ICD-10-CM | POA: Diagnosis not present

## 2023-10-03 DIAGNOSIS — Z3482 Encounter for supervision of other normal pregnancy, second trimester: Secondary | ICD-10-CM | POA: Diagnosis not present

## 2023-10-03 DIAGNOSIS — Z1389 Encounter for screening for other disorder: Secondary | ICD-10-CM | POA: Diagnosis not present

## 2023-10-03 DIAGNOSIS — Z3403 Encounter for supervision of normal first pregnancy, third trimester: Secondary | ICD-10-CM | POA: Diagnosis not present

## 2023-10-24 DIAGNOSIS — Z3403 Encounter for supervision of normal first pregnancy, third trimester: Secondary | ICD-10-CM | POA: Diagnosis not present

## 2023-10-24 DIAGNOSIS — N898 Other specified noninflammatory disorders of vagina: Secondary | ICD-10-CM | POA: Diagnosis not present

## 2023-11-02 DIAGNOSIS — Z3403 Encounter for supervision of normal first pregnancy, third trimester: Secondary | ICD-10-CM | POA: Diagnosis not present

## 2023-11-21 DIAGNOSIS — O26843 Uterine size-date discrepancy, third trimester: Secondary | ICD-10-CM | POA: Diagnosis not present

## 2023-11-21 DIAGNOSIS — O99213 Obesity complicating pregnancy, third trimester: Secondary | ICD-10-CM | POA: Diagnosis not present

## 2023-11-21 DIAGNOSIS — Z3A32 32 weeks gestation of pregnancy: Secondary | ICD-10-CM | POA: Diagnosis not present

## 2023-11-21 DIAGNOSIS — Z362 Encounter for other antenatal screening follow-up: Secondary | ICD-10-CM | POA: Diagnosis not present

## 2023-12-01 DIAGNOSIS — Z3403 Encounter for supervision of normal first pregnancy, third trimester: Secondary | ICD-10-CM | POA: Diagnosis not present

## 2023-12-03 DIAGNOSIS — Z3A33 33 weeks gestation of pregnancy: Secondary | ICD-10-CM | POA: Diagnosis not present

## 2023-12-03 DIAGNOSIS — O99013 Anemia complicating pregnancy, third trimester: Secondary | ICD-10-CM | POA: Diagnosis not present

## 2023-12-03 DIAGNOSIS — O368130 Decreased fetal movements, third trimester, not applicable or unspecified: Secondary | ICD-10-CM | POA: Diagnosis not present

## 2023-12-25 DIAGNOSIS — Z3403 Encounter for supervision of normal first pregnancy, third trimester: Secondary | ICD-10-CM | POA: Diagnosis not present

## 2023-12-25 DIAGNOSIS — O99891 Other specified diseases and conditions complicating pregnancy: Secondary | ICD-10-CM | POA: Diagnosis not present

## 2023-12-25 DIAGNOSIS — R0602 Shortness of breath: Secondary | ICD-10-CM | POA: Diagnosis not present

## 2023-12-25 DIAGNOSIS — R030 Elevated blood-pressure reading, without diagnosis of hypertension: Secondary | ICD-10-CM | POA: Diagnosis not present

## 2023-12-25 DIAGNOSIS — Z3A37 37 weeks gestation of pregnancy: Secondary | ICD-10-CM | POA: Diagnosis not present

## 2024-01-02 DIAGNOSIS — Z3A38 38 weeks gestation of pregnancy: Secondary | ICD-10-CM | POA: Diagnosis not present

## 2024-01-02 DIAGNOSIS — O163 Unspecified maternal hypertension, third trimester: Secondary | ICD-10-CM | POA: Diagnosis not present

## 2024-01-02 DIAGNOSIS — O99891 Other specified diseases and conditions complicating pregnancy: Secondary | ICD-10-CM | POA: Diagnosis not present

## 2024-01-02 DIAGNOSIS — O99013 Anemia complicating pregnancy, third trimester: Secondary | ICD-10-CM | POA: Diagnosis not present

## 2024-01-02 DIAGNOSIS — R609 Edema, unspecified: Secondary | ICD-10-CM | POA: Diagnosis not present

## 2024-01-05 DIAGNOSIS — O1404 Mild to moderate pre-eclampsia, complicating childbirth: Secondary | ICD-10-CM | POA: Diagnosis not present

## 2024-01-05 DIAGNOSIS — Z3A38 38 weeks gestation of pregnancy: Secondary | ICD-10-CM | POA: Diagnosis not present

## 2024-01-05 DIAGNOSIS — F410 Panic disorder [episodic paroxysmal anxiety] without agoraphobia: Secondary | ICD-10-CM | POA: Diagnosis not present

## 2024-01-05 DIAGNOSIS — D509 Iron deficiency anemia, unspecified: Secondary | ICD-10-CM | POA: Diagnosis not present

## 2024-01-05 DIAGNOSIS — Z79899 Other long term (current) drug therapy: Secondary | ICD-10-CM | POA: Diagnosis not present

## 2024-01-05 DIAGNOSIS — Z3689 Encounter for other specified antenatal screening: Secondary | ICD-10-CM | POA: Diagnosis not present

## 2024-01-05 DIAGNOSIS — O9902 Anemia complicating childbirth: Secondary | ICD-10-CM | POA: Diagnosis not present

## 2024-01-05 DIAGNOSIS — O9952 Diseases of the respiratory system complicating childbirth: Secondary | ICD-10-CM | POA: Diagnosis not present

## 2024-01-05 DIAGNOSIS — O134 Gestational [pregnancy-induced] hypertension without significant proteinuria, complicating childbirth: Secondary | ICD-10-CM | POA: Diagnosis not present

## 2024-01-05 DIAGNOSIS — O99344 Other mental disorders complicating childbirth: Secondary | ICD-10-CM | POA: Diagnosis not present

## 2024-01-05 DIAGNOSIS — J45909 Unspecified asthma, uncomplicated: Secondary | ICD-10-CM | POA: Diagnosis not present

## 2024-01-05 DIAGNOSIS — O26843 Uterine size-date discrepancy, third trimester: Secondary | ICD-10-CM | POA: Diagnosis not present

## 2024-01-05 DIAGNOSIS — O4292 Full-term premature rupture of membranes, unspecified as to length of time between rupture and onset of labor: Secondary | ICD-10-CM | POA: Diagnosis not present

## 2024-01-06 DIAGNOSIS — O133 Gestational [pregnancy-induced] hypertension without significant proteinuria, third trimester: Secondary | ICD-10-CM | POA: Diagnosis not present

## 2024-01-06 DIAGNOSIS — Z3A38 38 weeks gestation of pregnancy: Secondary | ICD-10-CM | POA: Diagnosis not present

## 2024-01-06 DIAGNOSIS — O09293 Supervision of pregnancy with other poor reproductive or obstetric history, third trimester: Secondary | ICD-10-CM | POA: Diagnosis not present

## 2024-01-06 DIAGNOSIS — O1404 Mild to moderate pre-eclampsia, complicating childbirth: Secondary | ICD-10-CM | POA: Diagnosis not present

## 2024-01-06 DIAGNOSIS — O4292 Full-term premature rupture of membranes, unspecified as to length of time between rupture and onset of labor: Secondary | ICD-10-CM | POA: Diagnosis not present

## 2024-01-06 DIAGNOSIS — O9902 Anemia complicating childbirth: Secondary | ICD-10-CM | POA: Diagnosis not present

## 2024-01-07 DIAGNOSIS — O135 Gestational [pregnancy-induced] hypertension without significant proteinuria, complicating the puerperium: Secondary | ICD-10-CM | POA: Diagnosis not present

## 2024-01-09 DIAGNOSIS — Z8759 Personal history of other complications of pregnancy, childbirth and the puerperium: Secondary | ICD-10-CM | POA: Diagnosis not present

## 2024-01-09 DIAGNOSIS — Z1389 Encounter for screening for other disorder: Secondary | ICD-10-CM | POA: Diagnosis not present

## 2024-01-09 DIAGNOSIS — Z1331 Encounter for screening for depression: Secondary | ICD-10-CM | POA: Diagnosis not present

## 2024-06-04 DIAGNOSIS — R42 Dizziness and giddiness: Secondary | ICD-10-CM | POA: Diagnosis not present

## 2024-06-04 DIAGNOSIS — Z1389 Encounter for screening for other disorder: Secondary | ICD-10-CM | POA: Diagnosis not present

## 2024-06-04 DIAGNOSIS — Z1331 Encounter for screening for depression: Secondary | ICD-10-CM | POA: Diagnosis not present
